# Patient Record
Sex: Female | Born: 1960 | Race: Black or African American | Hispanic: No | Marital: Married | Smoking: Former smoker
Health system: Southern US, Community
[De-identification: ages and names within clinical notes are randomized; demographics above are authoritative.]

## PROBLEM LIST (undated history)

## (undated) DIAGNOSIS — M1990 Unspecified osteoarthritis, unspecified site: Secondary | ICD-10-CM

## (undated) DIAGNOSIS — Z972 Presence of dental prosthetic device (complete) (partial): Secondary | ICD-10-CM

## (undated) DIAGNOSIS — R42 Dizziness and giddiness: Secondary | ICD-10-CM

## (undated) DIAGNOSIS — N390 Urinary tract infection, site not specified: Secondary | ICD-10-CM

## (undated) HISTORY — PX: COLONOSCOPY: SHX174

## (undated) HISTORY — PX: TUBAL LIGATION: SHX77

---

## 2005-01-03 ENCOUNTER — Other Ambulatory Visit: Payer: Self-pay

## 2005-01-03 ENCOUNTER — Emergency Department: Payer: Self-pay | Admitting: Emergency Medicine

## 2005-12-04 ENCOUNTER — Emergency Department: Payer: Self-pay | Admitting: Emergency Medicine

## 2006-03-14 ENCOUNTER — Ambulatory Visit: Payer: Self-pay | Admitting: Family Medicine

## 2006-08-26 ENCOUNTER — Emergency Department: Payer: Self-pay | Admitting: Emergency Medicine

## 2006-12-04 ENCOUNTER — Emergency Department: Payer: Self-pay | Admitting: Emergency Medicine

## 2008-08-19 ENCOUNTER — Ambulatory Visit: Payer: Self-pay

## 2008-08-27 ENCOUNTER — Ambulatory Visit: Payer: Self-pay

## 2009-11-10 ENCOUNTER — Ambulatory Visit: Payer: Self-pay

## 2013-05-20 ENCOUNTER — Emergency Department: Payer: Self-pay | Admitting: Emergency Medicine

## 2013-06-12 ENCOUNTER — Emergency Department: Payer: Self-pay | Admitting: Emergency Medicine

## 2013-06-12 LAB — URINALYSIS, COMPLETE
BACTERIA: NONE SEEN
Bilirubin,UR: NEGATIVE
Blood: NEGATIVE
Glucose,UR: NEGATIVE mg/dL (ref 0–75)
Ketone: NEGATIVE
LEUKOCYTE ESTERASE: NEGATIVE
Nitrite: NEGATIVE
PH: 6 (ref 4.5–8.0)
Protein: NEGATIVE
RBC,UR: NONE SEEN /HPF (ref 0–5)
SPECIFIC GRAVITY: 1.011 (ref 1.003–1.030)
WBC UR: <1 /HPF (ref 0–5)

## 2013-06-12 LAB — COMPREHENSIVE METABOLIC PANEL
ALBUMIN: 3.8 g/dL (ref 3.4–5.0)
AST: 18 U/L (ref 15–37)
Alkaline Phosphatase: 95 U/L
Anion Gap: 5 — ABNORMAL LOW (ref 7–16)
BILIRUBIN TOTAL: 0.3 mg/dL (ref 0.2–1.0)
BUN: 13 mg/dL (ref 7–18)
CHLORIDE: 105 mmol/L (ref 98–107)
CO2: 30 mmol/L (ref 21–32)
CREATININE: 0.78 mg/dL (ref 0.60–1.30)
Calcium, Total: 9.5 mg/dL (ref 8.5–10.1)
EGFR (African American): >60
EGFR (Non-African Amer.): >60
Glucose: 75 mg/dL (ref 65–99)
Osmolality: 278 (ref 275–301)
POTASSIUM: 3.4 mmol/L — AB (ref 3.5–5.1)
SGPT (ALT): 28 U/L (ref 12–78)
Sodium: 140 mmol/L (ref 136–145)
Total Protein: 8.1 g/dL (ref 6.4–8.2)

## 2013-06-12 LAB — CBC
HCT: 36.4 % (ref 35.0–47.0)
HGB: 12 g/dL (ref 12.0–16.0)
MCH: 28 pg (ref 26.0–34.0)
MCHC: 32.9 g/dL (ref 32.0–36.0)
MCV: 85 fL (ref 80–100)
Platelet: 286 10*3/uL (ref 150–440)
RBC: 4.29 10*6/uL (ref 3.80–5.20)
RDW: 12.9 % (ref 11.5–14.5)
WBC: 3.4 10*3/uL — AB (ref 3.6–11.0)

## 2013-06-12 LAB — LIPASE, BLOOD: Lipase: 153 U/L (ref 73–393)

## 2013-07-23 DIAGNOSIS — M5416 Radiculopathy, lumbar region: Secondary | ICD-10-CM | POA: Insufficient documentation

## 2013-07-23 DIAGNOSIS — R29898 Other symptoms and signs involving the musculoskeletal system: Secondary | ICD-10-CM | POA: Insufficient documentation

## 2014-05-26 ENCOUNTER — Emergency Department: Payer: Self-pay | Admitting: Emergency Medicine

## 2014-05-26 LAB — ED INFLUENZA
INFLBPCR: NEGATIVE
Influenza A By PCR: NEGATIVE

## 2014-05-26 LAB — URINALYSIS, COMPLETE
Bilirubin,UR: NEGATIVE
Blood: NEGATIVE
Glucose,UR: NEGATIVE mg/dL (ref 0–75)
KETONE: NEGATIVE
Nitrite: NEGATIVE
Ph: 8 (ref 4.5–8.0)
Protein: NEGATIVE
RBC,UR: 2 /HPF (ref 0–5)
SPECIFIC GRAVITY: 1.01 (ref 1.003–1.030)
WBC UR: 58 /HPF (ref 0–5)

## 2014-05-26 LAB — CBC WITH DIFFERENTIAL/PLATELET
Bands: 1 %
Comment - H1-Com1: NORMAL
HCT: 36.7 % (ref 35.0–47.0)
HGB: 12.2 g/dL (ref 12.0–16.0)
LYMPHS PCT: 27 %
MCH: 27.7 pg (ref 26.0–34.0)
MCHC: 33.2 g/dL (ref 32.0–36.0)
MCV: 84 fL (ref 80–100)
Monocytes: 8 %
Platelet: 267 10*3/uL (ref 150–440)
RBC: 4.39 10*6/uL (ref 3.80–5.20)
RDW: 12.7 % (ref 11.5–14.5)
SEGMENTED NEUTROPHILS: 64 %
WBC: 6.4 10*3/uL (ref 3.6–11.0)

## 2014-05-26 LAB — BASIC METABOLIC PANEL
ANION GAP: 9 (ref 7–16)
BUN: 7 mg/dL
CREATININE: 0.64 mg/dL
Calcium, Total: 9.5 mg/dL
Chloride: 104 mmol/L
Co2: 27 mmol/L
EGFR (African American): >60
Glucose: 81 mg/dL
Potassium: 3.4 mmol/L — ABNORMAL LOW
SODIUM: 140 mmol/L

## 2014-05-28 LAB — URINE CULTURE

## 2014-12-12 ENCOUNTER — Emergency Department
Admission: EM | Admit: 2014-12-12 | Discharge: 2014-12-12 | Disposition: A | Discharge status: Home / Self Care | Payer: BLUE CROSS/BLUE SHIELD | Attending: Emergency Medicine | Admitting: Emergency Medicine

## 2014-12-12 ENCOUNTER — Encounter: Payer: Self-pay | Admitting: Emergency Medicine

## 2014-12-12 DIAGNOSIS — N390 Urinary tract infection, site not specified: Secondary | ICD-10-CM | POA: Diagnosis not present

## 2014-12-12 DIAGNOSIS — Z87891 Personal history of nicotine dependence: Secondary | ICD-10-CM | POA: Insufficient documentation

## 2014-12-12 DIAGNOSIS — R300 Dysuria: Secondary | ICD-10-CM | POA: Diagnosis present

## 2014-12-12 LAB — URINALYSIS COMPLETE WITH MICROSCOPIC (ARMC ONLY)
BACTERIA UA: NONE SEEN
BILIRUBIN URINE: NEGATIVE
Glucose, UA: NEGATIVE mg/dL
KETONES UR: NEGATIVE mg/dL
NITRITE: NEGATIVE
PH: 8 (ref 5.0–8.0)
Protein, ur: 100 mg/dL — AB
SPECIFIC GRAVITY, URINE: 1.009 (ref 1.005–1.030)
Squamous Epithelial / LPF: NONE SEEN

## 2014-12-12 LAB — CHLAMYDIA/NGC RT PCR (ARMC ONLY)
Chlamydia Tr: NOT DETECTED
N GONORRHOEAE: NOT DETECTED

## 2014-12-12 MED ORDER — PHENAZOPYRIDINE HCL 200 MG PO TABS: 200.0000 mg | ORAL_TABLET | Freq: Three times a day (TID) | ORAL | Status: DC | PRN

## 2014-12-12 MED ORDER — CEFTRIAXONE SODIUM 1 G IJ SOLR
1.0000 g | Freq: Once | INTRAMUSCULAR | Status: AC
  Administered 2014-12-12: 1 g via INTRAVENOUS
  Filled 2014-12-12: qty 10

## 2014-12-12 MED ORDER — SULFAMETHOXAZOLE-TRIMETHOPRIM 800-160 MG PO TABS
1.0000 | ORAL_TABLET | Freq: Once | ORAL | Status: AC
Start: 2014-12-12 — End: 2014-12-12
  Administered 2014-12-12: 1 via ORAL
  Filled 2014-12-12: qty 1

## 2014-12-12 MED ORDER — SULFAMETHOXAZOLE-TRIMETHOPRIM 800-160 MG PO TABS: 1.0000 | ORAL_TABLET | Freq: Two times a day (BID) | ORAL | Status: DC

## 2014-12-12 MED ORDER — PHENAZOPYRIDINE HCL 200 MG PO TABS
200.0000 mg | ORAL_TABLET | Freq: Once | ORAL | Status: AC
  Administered 2014-12-12: 200 mg via ORAL
  Filled 2014-12-12: qty 1

## 2014-12-12 NOTE — Discharge Instructions (Signed)
Antibiotic Medicine °Antibiotic medicines are used to treat infections caused by bacteria. They work by injuring or killing the bacteria that is making you sick. °HOW IS AN ANTIBIOTIC CHOSEN? °An antibiotic is chosen based on many factors. To help your health care provider choose one for you, tell your health care provider if: °· You have any allergies. °· You are pregnant or plan to get pregnant. °· You are breastfeeding. °· You are taking any medicines. These include over-the-counter medicines, prescription medicines, and herbal remedies. °· You have a medical condition or problem you have not already discussed. °Your health care provider will also consider: °· How often the medicine has to be taken. °· Common side effects of the medicine. °· The cost of the medicine. °· The taste of the medicine. °If you have questions about why an antibiotic was chosen, make sure to ask. °FOR HOW LONG SHOULD I TAKE MY ANTIBIOTIC? °Continue to take your antibiotic for as long as told by your health care provider. Do not stop taking it when you feel better. If you stop taking it too soon: °· You may start to feel sick again. °· Your infection may become harder to treat. °· Complications may develop. °WHAT IF I MISS A DOSE? °Try not to miss any doses of medicine. If you miss a dose, take it as soon as possible. However, if it is almost time for the next dose: °· If you are taking 2 doses per day, take the missed dose and the next dose 5 to 6 hours apart. °· If you are taking 3 or more doses per day, take the missed dose and the next dose 2 to 4 hours apart, then go back to the normal schedule. °If you cannot make up a missed dose, take the next scheduled dose on time. Then take the missed dose after you have taken all the doses as recommended by your health care provider, as if you had one more dose left. °DO ANTIBIOTICS AFFECT BIRTH CONTROL? °Birth control pills may not work while you are on antibiotics. If you are taking birth  control pills, continue taking them as usual and use a second form of birth control, such as a condom, to avoid unwanted pregnancy. Continue using the second form of birth control until you are finished with your current 1 month cycle of birth control pills. °OTHER INFORMATION °· If there is any medicine left over, throw it away. °· Never take someone else's antibiotics. °· Never take leftover antibiotics. °SEEK MEDICAL CARE IF: °· You get worse. °· You do not feel better within a few days of starting the antibiotic medicine. °· You vomit. °· White patches appear in your mouth. °· You have new joint pain that begins after starting the antibiotic. °· You have new muscle aches that begin after starting the antibiotic. °· You had a fever before starting the antibiotic and it returns. °· You have any symptoms of an allergic reaction, such as an itchy rash. If this happens, stop taking the antibiotic. °SEEK IMMEDIATE MEDICAL CARE IF: °· Your urine turns dark or becomes blood-colored. °· Your skin turns yellow. °· You bruise or bleed easily. °· You have severe diarrhea and abdominal cramps. °· You have a severe headache. °· You have signs of a severe allergic reaction, such as: °¨ Trouble breathing. °¨ Wheezing. °¨ Swelling of the lips, tongue, or face. °¨ Fainting. °¨ Blisters on the skin or in the mouth. °If you have signs of a severe allergic   reaction, stop taking the antibiotic right away. °  °This information is not intended to replace advice given to you by your health care provider. Make sure you discuss any questions you have with your health care provider. °  °Document Released: 10/27/2003 Document Revised: 11/04/2014 Document Reviewed: 07/01/2014 °Elsevier Interactive Patient Education ©2016 [employer] ° °

## 2014-12-12 NOTE — ED Notes (Signed)
Pt. Driving home.

## 2014-12-12 NOTE — ED Notes (Signed)
Pt. States dysuria for the past couple days worse Friday.  Pt. States bladder infection early this year.  Pt. States frequency and painful urination.

## 2014-12-12 NOTE — ED Notes (Signed)
"  I think I have a bladder or kidney infection."  Patient reports dysuria, frequency and urgency all day Friday.

## 2014-12-12 NOTE — ED Provider Notes (Signed)
[facility] Emergency Department Provider Note  ____________________________________________  Time seen: Approximately 4:17 AM  I have reviewed the triage vital signs and the nursing notes.   HISTORY  Chief Complaint Dysuria    HPI Meghan Coleman is a 54 y.o. female who reports urgency frequency and dysuria for the last couple days. She's had a UTI before and reports the first antibiotic did not work and she ended up having to get a shot afterward. She denies fever but says she feels very warm and hot. His feels like it like a urinary tract infection. She does not think she has any reason to worry about an STD. Does not have any vaginal discharge. She does have some suprapubic pain and palpation are makes her feel a need to urinate. She denies any CVA tenderness.  History reviewed. No pertinent past medical history.  There are no active problems to display for this patient.   Past Surgical History  Procedure Laterality Date  . Tubal ligation      Current Outpatient Rx  Name  Route  Sig  Dispense  Refill  . ibuprofen (ADVIL,MOTRIN) 200 MG tablet   Oral   Take 200 mg by mouth every 8 (eight) hours as needed.         . phenazopyridine (PYRIDIUM) 200 MG tablet   Oral   Take 1 tablet (200 mg total) by mouth 3 (three) times daily as needed for pain.   20 tablet   0   . sulfamethoxazole-trimethoprim (BACTRIM DS,SEPTRA DS) 800-160 MG tablet   Oral   Take 1 tablet by mouth 2 (two) times daily.   9 tablet   0     Allergies Review of patient's allergies indicates no known allergies.  No family history on file.  Social History Social History  Substance Use Topics  . Smoking status: Former Games developer  . Smokeless tobacco: Never Used  . Alcohol Use: No    Review of Systems Constitutional: No fever/chills Eyes: No visual changes. ENT: No sore throat. Cardiovascular: Denies chest pain. Respiratory: Denies shortness of breath. Gastrointestinal:   see history of present illness No nausea, no vomiting.  No diarrhea.  No constipation. GeSee history of present illness Musculoskeletal:  patient does report bilateral flank pain below the CVA areas Skin: Negative for rash. Neurological: Negative for headaches, focal weakness or numbness.  10-point ROS otherwise negative.  ____________________________________________   PHYSICAL EXAM:  VITAL SIGNS: ED Triage Vitals  Enc Vitals Group     BP 12/12/14 0223 140/82 mmHg     Pulse Rate 12/12/14 0223 106     Resp 12/12/14 0223 20     Temp 12/12/14 0223 99.5 F (37.5 C)     Temp Source 12/12/14 0223 Oral     SpO2 12/12/14 0223 100 %     Weight 12/12/14 0223 189 lb (85.73 kg)     Height 12/12/14 0223  (1.575 m)     Head Cir --      Peak Flow --      Pain Score 12/12/14 0224 8     Pain Loc --      Pain Edu? --      Excl. in GC? --     Constitutional: Alert and oriented. Well appearing and in no acute distress. Eyes: Conjunctivae are normal. PERRL. EOMI. Head: Atraumatic. Nose: No congestion/rhinnorhea. Mouth/Throat: Mucous membranes are moist.  Oropharynx non-erythematous. Neck: No stridor.   Cardiovascular: Normal rate, regular rhythm. Grossly normal heart sounds.  Good peripheral circulation. Respiratory: Normal respiratory effort.  No retractions. Lungs CTAB. Gastrointestinal: Soft and nontender. Palpation of suprapubic area reproduces her need to urinate. No distention. No abdominal bruits. No CVA tenderness. Musculoskeletal: No lower extremity tenderness nor edema.  No joint effusions. Neurologic:  Normal speech and language. No gross focal neurologic deficits are appreciated. No gait instability. Skin:  Skin is warm, dry and intact. No rash noted.   ____________________________________________   LABS (all labs ordered are listed, but only abnormal results are displayed)  Labs Reviewed  URINALYSIS COMPLETEWITH MICROSCOPIC ([facility] ONLY) - Abnormal; Notable for the  following:    Color, Urine STRAW (*)    APPearance CLOUDY (*)    Hgb urine dipstick 3+ (*)    Protein, ur 100 (*)    Leukocytes, UA 3+ (*)    All other components within normal limits  CHLAMYDIA/NGC RT PCR ([facility] ONLY)   ____________________________________________  EKG   ____________________________________________  RADIOLOGY   ____________________________________________   PROCEDURES    ____________________________________________   INITIAL IMPRESSION / ASSESSMENT AND PLAN / ED COURSE  Pertinent labs & imaging results that were available during my care of the patient were reviewed by me and considered in my medical decision making (see chart for details).   ____________________________________________   FINAL CLINICAL IMPRESSION(S) / ED DIAGNOSES  Final diagnoses:  UTI (lower urinary tract infection)      Arnaldo NatalPaul F Malinda, MD 12/12/14 [phone]

## 2014-12-16 ENCOUNTER — Emergency Department
Admission: EM | Admit: 2014-12-16 | Discharge: 2014-12-16 | Disposition: A | Discharge status: Home / Self Care | Payer: Self-pay | Attending: Emergency Medicine | Admitting: Emergency Medicine

## 2014-12-16 DIAGNOSIS — K120 Recurrent oral aphthae: Secondary | ICD-10-CM | POA: Insufficient documentation

## 2014-12-16 DIAGNOSIS — R300 Dysuria: Secondary | ICD-10-CM | POA: Insufficient documentation

## 2014-12-16 DIAGNOSIS — Z79899 Other long term (current) drug therapy: Secondary | ICD-10-CM | POA: Insufficient documentation

## 2014-12-16 DIAGNOSIS — Z87891 Personal history of nicotine dependence: Secondary | ICD-10-CM | POA: Insufficient documentation

## 2014-12-16 MED ORDER — LIDOCAINE VISCOUS 2 % MT SOLN: 5.0000 mL | OROMUCOSAL | Status: DC | PRN

## 2014-12-16 MED ORDER — TRIAMCINOLONE ACETONIDE 0.1 % MT PSTE: 1.0000 "application " | PASTE | Freq: Two times a day (BID) | OROMUCOSAL | Status: DC

## 2014-12-16 NOTE — Discharge Instructions (Signed)
Canker Sores °Canker sores are small, painful sores that develop inside your mouth. They may also be called aphthous ulcers. You can get canker sores on the inside of your lips or cheeks, on your tongue, or anywhere inside your mouth. You can have just one canker sore or several of them. Canker sores cannot be passed from one person to another (noncontagious). These sores are different than the sores that you may get on the outside of your lips (cold sores or fever blisters). °Canker sores usually start as painful red bumps. Then they turn into small white, yellow, or gray ulcers that have red borders. The ulcers may be quite painful. The pain may be worse when you eat or drink. °CAUSES °The cause of this condition is not known. °RISK FACTORS °This condition is more likely to develop in: °· Women. °· People in their teens or 20s. °· Women who are having their menstrual period. °· People who are under a lot of emotional stress. °· People who do not get enough iron or B vitamins. °· People who have poor oral hygiene. °· People who have an injury inside the mouth. This can happen after having dental work or from chewing something hard. °SYMPTOMS °Along with the canker sore, symptoms may also include: °· Fever. °· Fatigue. °· Swollen lymph nodes in your neck. °DIAGNOSIS °This condition can be diagnosed based on your symptoms. Your health care provider will also examine your mouth. Your health care provider may also do tests if you get canker sores often or if they are very bad. Tests may include: °· Blood tests to rule out other causes of canker sores. °· Taking swabs from the sore to check for infection. °· Taking a small piece of skin from the sore (biopsy) to test it for cancer. °TREATMENT °Most canker sores clear up without treatment in about 10 days. Home care is usually the only treatment that you will need. Over-the-counter medicines can relieve discomfort. If you have severe canker sores, your health care  provider may prescribe: °· Numbing ointment to relieve pain. °· Vitamins. °· Steroid medicines. These may be given as: °¨ Oral pills. °¨ Mouth rinses. °¨ Gels. °· Antibiotic mouth rinse. °HOME CARE INSTRUCTIONS °· Apply, take, or use medicines only as directed by your health care provider. These include vitamins. °· If you were prescribed an antibiotic mouth rinse, finish all of it even if you start to feel better. °· Until the sores are healed: °¨ Do not drink coffee or citrus juices. °¨ Do not eat spicy or salty foods. °· Use a mild, over-the-counter mouth rinse as directed by your health care provider. °· Practice good oral hygiene. °¨ Floss your teeth every day. °¨ Brush your teeth with a soft brush twice each day. °SEEK MEDICAL CARE IF: °· Your symptoms do not get better after two weeks. °· You also have a fever or swollen glands. °· You get canker sores often. °· You have a canker sore that is getting larger. °· You cannot eat or drink due to your canker sores. °  °This information is not intended to replace advice given to you by your health care provider. Make sure you discuss any questions you have with your health care provider. °  °Document Released: 06/10/2010 Document Revised: 06/30/2014 Document Reviewed: 01/14/2014 °Elsevier Interactive Patient Education ©2016 Else[employer] Inc. ° °

## 2014-12-16 NOTE — ED Provider Notes (Signed)
[facility] Emergency Department Provider Note  ____________________________________________  Time seen: Approximately 11:06 AM  I have reviewed the triage vital signs and the nursing notes.   HISTORY  Chief Complaint Allergic Reaction    HPI Meghan Coleman is a 54 y.o. female patient complain oral lesions on the inner lower lip and tongue. Patient states these lesions are painful. Onset 2 days ago status post started antibiotics  for urinary tract infection.Patient denies any fever associated this complaint. Patient states she is unable to brush her teeth secondary to the pain. No palliative measures taken for this complaint. Patient is rating her pain as a 6/10. Patient discontinue the antibiotics that she thought this was a causative agent for this complaint.   History reviewed. No pertinent past medical history.  There are no active problems to display for this patient.   Past Surgical History  Procedure Laterality Date  . Tubal ligation      Current Outpatient Rx  Name  Route  Sig  Dispense  Refill  . ibuprofen (ADVIL,MOTRIN) 200 MG tablet   Oral   Take 200 mg by mouth every 8 (eight) hours as needed.         . phenazopyridine (PYRIDIUM) 200 MG tablet   Oral   Take 1 tablet (200 mg total) by mouth 3 (three) times daily as needed for pain.   20 tablet   0   . sulfamethoxazole-trimethoprim (BACTRIM DS,SEPTRA DS) 800-160 MG tablet   Oral   Take 1 tablet by mouth 2 (two) times daily.   9 tablet   0   . triamcinolone (KENALOG) 0.1 % paste   Mouth/Throat   Use as directed 1 application in the mouth or throat 2 (two) times daily.   5 g   1     Allergies Review of patient's allergies indicates no known allergies.  History reviewed. No pertinent family history.  Social History Social History  Substance Use Topics  . Smoking status: Former Games developermoker  . Smokeless tobacco: Never Used  . Alcohol Use: No    Review of  Systems Constitutional: No fever/chills Eyes: No visual changes. ENT: No sore throat. Sores in mouth. Cardiovascular: Denies chest pain. Respiratory: Denies shortness of breath. Gastrointestinal: No abdominal pain.  No nausea, no vomiting.  No diarrhea.  No constipation. Genitourinary: Positive for dysuria. Musculoskeletal: Negative for back pain. Skin: Negative for rash. Neurological: Negative for headaches, focal weakness or numbness. 10-point ROS otherwise negative.  ____________________________________________   PHYSICAL EXAM:  VITAL SIGNS: ED Triage Vitals  Enc Vitals Group     BP 12/16/14 1031 130/80 mmHg     Pulse Rate 12/16/14 1031 90     Resp 12/16/14 1031 16     Temp 12/16/14 1031 98.9 F (37.2 C)     Temp Source 12/16/14 1031 Oral     SpO2 12/16/14 1031 96 %     Weight 12/16/14 1031 189 lb (85.73 kg)     Height 12/16/14 1031 5\' 1"  (1.549 m)     Head Cir --      Peak Flow --      Pain Score 12/16/14 1032 6     Pain Loc --      Pain Edu? --      Excl. in GC? --     Constitutional: Alert and oriented. Well appearing and in no acute distress. Eyes: Conjunctivae are normal. PERRL. EOMI. Head: Atraumatic. Nose: No congestion/rhinnorhea. Mouth/Throat: Mucous membranes are moist.  Oropharynx non-erythematous.  Oval grayish lesion inside lower lip. Hematological/Lymphatic/Immunilogical: No cervical lymphadenopathy. Cardiovascular: Normal rate, regular rhythm. Grossly normal heart sounds.  Good peripheral circulation. Respiratory: Normal respiratory effort.  No retractions. Lungs CTAB. Gastrointestinal: Soft and nontender. No distention. No abdominal bruits. No CVA tenderness. Musculoskeletal: No lower extremity tenderness nor edema.  No joint effusions. Neurologic:  Normal speech and language. No gross focal neurologic deficits are appreciated. No gait instability. Skin:  Skin is warm, dry and intact. No rash noted. Psychiatric: Mood and affect are normal. Speech  and behavior are normal.  ____________________________________________   LABS (all labs ordered are listed, but only abnormal results are displayed)  Labs Reviewed - No data to display ____________________________________________  EKG   ____________________________________________  RADIOLOGY   ____________________________________________   PROCEDURES  Procedure(s) performed: None  Critical Care performed: No  ____________________________________________   INITIAL IMPRESSION / ASSESSMENT AND PLAN / ED COURSE  Pertinent labs & imaging results that were available during my care of the patient were reviewed by me and considered in my medical decision making (see chart for details).  Aphthous ulcer inner lower lip. Patient given a prescription for Orabase and viscous lidocaine. Patient advised follow-up family doctor in one week if condition persists. Patient also advised to restart antibiotics for urinary tract infection.  ____________________________________________   FINAL CLINICAL IMPRESSION(S) / ED DIAGNOSES  Final diagnoses:  Canker sores oral      Joni Reining, PA-C 12/16/14 1112  Darien Ramus, MD 12/16/14 1530

## 2014-12-16 NOTE — ED Notes (Signed)
Patient reports coming to ED this past Saturday for UTI and discharged on antibiotic, which patient can't remember name of.  Patient reports first dose on medication in ER and then took second and third dose at home when she noticed a sore throat that developed into sores in mouth.  Patient reports not taking medication in hopes that symptoms would go away.

## 2015-02-04 ENCOUNTER — Emergency Department (HOSPITAL_COMMUNITY)
Admission: EM | Admit: 2015-02-04 | Discharge: 2015-02-04 | Disposition: A | Discharge status: Home / Self Care | Payer: BLUE CROSS/BLUE SHIELD | Attending: Emergency Medicine | Admitting: Emergency Medicine

## 2015-02-04 ENCOUNTER — Encounter (HOSPITAL_COMMUNITY): Payer: Self-pay | Admitting: Emergency Medicine

## 2015-02-04 DIAGNOSIS — R110 Nausea: Secondary | ICD-10-CM | POA: Insufficient documentation

## 2015-02-04 DIAGNOSIS — L511 Stevens-Johnson syndrome: Secondary | ICD-10-CM | POA: Diagnosis not present

## 2015-02-04 DIAGNOSIS — R630 Anorexia: Secondary | ICD-10-CM | POA: Insufficient documentation

## 2015-02-04 DIAGNOSIS — Z87891 Personal history of nicotine dependence: Secondary | ICD-10-CM | POA: Insufficient documentation

## 2015-02-04 DIAGNOSIS — K1379 Other lesions of oral mucosa: Secondary | ICD-10-CM | POA: Diagnosis present

## 2015-02-04 DIAGNOSIS — T450X5A Adverse effect of antiallergic and antiemetic drugs, initial encounter: Secondary | ICD-10-CM | POA: Insufficient documentation

## 2015-02-04 LAB — COMPREHENSIVE METABOLIC PANEL
ALK PHOS: 68 U/L (ref 38–126)
ALT: 18 U/L (ref 14–54)
ANION GAP: 11 (ref 5–15)
AST: 21 U/L (ref 15–41)
Albumin: 4.2 g/dL (ref 3.5–5.0)
BILIRUBIN TOTAL: 0.5 mg/dL (ref 0.3–1.2)
BUN: 5 mg/dL — ABNORMAL LOW (ref 6–20)
CALCIUM: 10.1 mg/dL (ref 8.9–10.3)
CO2: 27 mmol/L (ref 22–32)
CREATININE: 0.69 mg/dL (ref 0.44–1.00)
Chloride: 102 mmol/L (ref 101–111)
Glucose, Bld: 105 mg/dL — ABNORMAL HIGH (ref 65–99)
Potassium: 3.8 mmol/L (ref 3.5–5.1)
SODIUM: 140 mmol/L (ref 135–145)
TOTAL PROTEIN: 8.3 g/dL — AB (ref 6.5–8.1)

## 2015-02-04 LAB — URINALYSIS, ROUTINE W REFLEX MICROSCOPIC
Bilirubin Urine: NEGATIVE
GLUCOSE, UA: NEGATIVE mg/dL
Hgb urine dipstick: NEGATIVE
KETONES UR: 15 mg/dL — AB
LEUKOCYTES UA: NEGATIVE
Nitrite: NEGATIVE
PH: 5.5 (ref 5.0–8.0)
Protein, ur: NEGATIVE mg/dL
SPECIFIC GRAVITY, URINE: 1.013 (ref 1.005–1.030)

## 2015-02-04 LAB — CBC
HEMATOCRIT: 36.2 % (ref 36.0–46.0)
HEMOGLOBIN: 12.1 g/dL (ref 12.0–15.0)
MCH: 27.9 pg (ref 26.0–34.0)
MCHC: 33.4 g/dL (ref 30.0–36.0)
MCV: 83.6 fL (ref 78.0–100.0)
Platelets: 270 10*3/uL (ref 150–400)
RBC: 4.33 MIL/uL (ref 3.87–5.11)
RDW: 12.3 % (ref 11.5–15.5)
WBC: 6.1 10*3/uL (ref 4.0–10.5)

## 2015-02-04 MED ORDER — MAGIC MOUTHWASH
15.0000 mL | Freq: Once | ORAL | Status: AC
  Administered 2015-02-04: 15 mL via ORAL
  Filled 2015-02-04: qty 15

## 2015-02-04 MED ORDER — ACETAMINOPHEN 325 MG PO TABS
650.0000 mg | ORAL_TABLET | Freq: Once | ORAL | Status: AC | PRN
  Administered 2015-02-04: 650 mg via ORAL

## 2015-02-04 MED ORDER — SODIUM CHLORIDE 0.9 % IV BOLUS (SEPSIS)
1000.0000 mL | Freq: Once | INTRAVENOUS | Status: AC
  Administered 2015-02-04: 1000 mL via INTRAVENOUS

## 2015-02-04 MED ORDER — ACETAMINOPHEN 325 MG PO TABS
ORAL_TABLET | ORAL | Status: AC
  Filled 2015-02-04: qty 2

## 2015-02-04 MED ORDER — MAGIC MOUTHWASH: 10.0000 mL | Freq: Four times a day (QID) | ORAL | Status: DC | PRN

## 2015-02-04 MED ORDER — LIDOCAINE VISCOUS 2 % MT SOLN
15.0000 mL | Freq: Once | OROMUCOSAL | Status: AC
  Administered 2015-02-04: 15 mL via OROMUCOSAL
  Filled 2015-02-04: qty 15

## 2015-02-04 NOTE — ED Provider Notes (Signed)
CSN: [id number]     Arrival date & time 02/04/15  1048 History   First MD Initiated Contact with Patient 02/04/15 1212     Chief Complaint  Patient presents with  . Medication Reaction  . Mouth Lesions  . Urinary Tract Infection     (Consider location/radiation/quality/duration/timing/severity/associated sxs/prior Treatment) HPI Comments: Took 1 dose of bactrim on Sunday and mouth sores began to show up Lesions in mouth and nose, worsening dysuria Benadryl helped, no current dysuria Eyes looked red prior to taking benadryl, throat swelling improved with benadryl Had similar reaction 1 month ago No other skin lesions/rash No SOB/lightheadedness     Patient is a 54 y.o. female presenting with mouth sores and urinary tract infection.  Mouth Lesions Associated symptoms: no fever, no neck pain, no rash and no sore throat   Urinary Tract Infection Associated symptoms: nausea   Associated symptoms: no abdominal pain, no fever and no vomiting     History reviewed. No pertinent past medical history. Past Surgical History  Procedure Laterality Date  . Tubal ligation     History reviewed. No pertinent family history. Social History  Substance Use Topics  . Smoking status: Former Games developermoker  . Smokeless tobacco: Never Used  . Alcohol Use: No   OB History    No data available     Review of Systems  Constitutional: Positive for appetite change. Negative for fever.  HENT: Positive for mouth sores. Negative for sore throat.   Eyes: Negative for visual disturbance.  Respiratory: Negative for cough and shortness of breath.   Cardiovascular: Negative for chest pain.  Gastrointestinal: Positive for nausea. Negative for vomiting and abdominal pain.  Genitourinary: Negative for dysuria (did have) and difficulty urinating.  Musculoskeletal: Negative for back pain and neck pain.  Skin: Positive for wound. Negative for rash.  Neurological: Negative for syncope, light-headedness and  headaches.      Allergies  Bactrim  Home Medications   Prior to Admission medications   Medication Sig Start Date End Date Taking? Authorizing Provider  lidocaine (XYLOCAINE) 2 % solution Use as directed 5 mLs in the mouth or throat as needed for mouth pain. Apply small amount to oral lesion as needed for pain. Patient not taking: Reported on 02/04/2015 12/16/14   Joni Reiningonald K Smith, PA-C  magic mouthwash SOLN Take 10 mLs by mouth 4 (four) times daily as needed for mouth pain. 02/04/15   Alvira MondayErin Psalms Olarte, MD  triamcinolone (KENALOG) 0.1 % paste Use as directed 1 application in the mouth or throat 2 (two) times daily. Patient not taking: Reported on 02/04/2015 12/16/14   Joni Reiningonald K Smith, PA-C   BP 132/75 mmHg  Pulse 90  Temp(Src) 100.6 F (38.1 C) (Oral)  Resp 16  SpO2 100% Physical Exam  Constitutional: She is oriented to person, place, and time. She appears well-developed and well-nourished. No distress.  HENT:  Head: Normocephalic and atraumatic.  Dry lips, oral ulcers at edge of mucosal border (no surrounding erythema, no purulence), many scatter apthous ulcers, mild erythema of tongue, no lingual swelling, no fullness of sublingual area  Left nares with mucosal lesion   Eyes: Conjunctivae and EOM are normal. Pupils are equal, round, and reactive to light. Right conjunctiva is not injected. Right conjunctiva has no hemorrhage. Left conjunctiva is not injected. Left conjunctiva has no hemorrhage. Right pupil is round and reactive. Left pupil is round and reactive. Pupils are equal.  Denies visual changes  Neck: Normal range of motion.  Cardiovascular: Normal rate,  regular rhythm, normal heart sounds and intact distal pulses.  Exam reveals no gallop and no friction rub.   No murmur heard. Pulmonary/Chest: Effort normal and breath sounds normal. No respiratory distress. She has no wheezes. She has no rales.  Abdominal: Soft. She exhibits no distension. There is no tenderness. There is no  guarding.  Musculoskeletal: She exhibits no edema or tenderness.  Neurological: She is alert and oriented to person, place, and time.  Skin: Skin is warm and dry. No rash noted. She is not diaphoretic. No erythema.  Nursing note and vitals reviewed.   ED Course  Procedures (including critical care time) Labs Review Labs Reviewed  URINALYSIS, ROUTINE W REFLEX MICROSCOPIC (NOT AT [facility]) - Abnormal; Notable for the following:    APPearance CLOUDY (*)    Ketones, ur 15 (*)    All other components within normal limits  COMPREHENSIVE METABOLIC PANEL - Abnormal; Notable for the following:    Glucose, Bld 105 (*)    BUN 5 (*)    Total Protein 8.3 (*)    All other components within normal limits  CBC    Imaging Review No results found. I have personally reviewed and evaluated these images and lab results as part of my medical decision-making.   EKG Interpretation None      MDM   Final diagnoses:  Stevens-Johnson disease (HCC)   54 year old female with no significant medical history presents with concern of oral and nasal ulcerations after starting Bactrim on Sunday.  Patient reports she took 1 dose of Bactrim and developed these oral lesions, reports she's had one episode of similar oral lesions after taking Bactrim.   Patient has signs of significant aphthous ulcers, and nasal ulcer, no conjunctivitis, no skin rash or peeling. Mucosal involvement after taking Bactrim with fever is concerning for Stevens-Johnson syndrome, however patient has no skin rash or denudation, is hemodynamically stable, with normal electrolytes.    Discussed admission vs discharge with patient for supportive care including IV fluids, however patient wishes for trial of outpatient management, monitoring of symptoms, and outpatient supportive care with fluids and return to the ED if she develops skin rash, worsening fever, redness from the lesions, or inability to tolerate po.  NO sign of ocular involvement at  this time.    Patient given 1L of NS prior to discharge, and rx for magic mouthwash.  Other possible etiologies for patients symptoms include Behcet's disease, other viral syndrome, however given timing with bactrim have suspicion for drug reaction (although dysuria experienced with both episodes may have also indicated beginning of autoimmune or other phenomena--and recommend PCP follow up).    Patient with fever 100.6, without any other localizing signs of infection, including no sign of urinary tract infection, no cough, no extending erythema or signs of superinfection of oral lesions and feel that fever is likely secondary to drug reaction.  Discussed reasons to return, need for close PCP follow up with PCP in detail and that she should NOT take bactrim. Patient discharged in stable condition with understanding of reasons to return.     Alvira Monday, MD 02/04/15 [phone]

## 2015-02-04 NOTE — ED Notes (Signed)
Pt from home with c/o mouth sores starting this past Sunday after taking medication for UTI.  Pt reports the same reaction months ago when given the same medication in the past.  Pt was told at the time the reaction was not related to the medication.  Ambulatory, NAD, A&O.

## 2015-02-04 NOTE — Discharge Instructions (Signed)
Stevens-Johnson Syndrome Stevens-Johnson syndrome is a disorder of the mucous membranes and skin. This disorder causes these things to happen:  The mucous membranes become inflamed.  The top layer of skin dies and starts to shed. The more skin that dies, the more serious the disorder becomes.  The body loses fluids quickly.  The body loses its ability to keep germs out. This condition requires immediate treatment to prevent complications such as:  Too much fluid loss.  Blood infection.  Eye damage.  Skin damage and infection.  Vision loss, if the eyes are affected.  Damage to the lungs, heart, kidneys, or liver. CAUSES The most common cause of this condition is an allergic reaction to a medicine. Medicines that are known to cause this condition include:  Antibiotic medicines.  Antiseizure medicines.  Medicine that is used to treat gout.  Cocaine.  NSAIDs. This condition can also be caused by an infection. In some cases, the cause may not be known. RISK FACTORS This condition is more likely to develop in:  People who have a variation in the HLA gene. This variation may be passed down through families (inherited).  People who have a family history of Stevens-Johnson syndrome.  People who have cancer or are having cancer treatment.  People who have a weak body defense system (immune system).  People who have systemic lupus erythematosus.  People of [place], [place], or [place] descent. SYMPTOMS This condition often begins with several days of flu-like symptoms, such as:  Fever.  Sore throat.  Fatigue.  Headache.  Muscle aches.  Dry cough.  Burning feeling in the eyes. Then, a painful red or purple rash may develop on the face, trunk, palms, or soles, and spread to other parts of the body. The rash creates blisters and open sores on the skin. If the mucous membranes are affected, the rash may be in:  The mouth.  The nose.  The eyes.  The  genitals.  The digestive tract.  The urinary tract. Other signs and symptoms include:  Shedding of the skin or mucous membrane.  Swelling of the tongue and face.  Swelling and itching of the skin (hives).  Redness, sensitivity to light, and dryness in the eyes.  Pain in the mouth and throat.  Pain when passing urine.  Pain when swallowing. DIAGNOSIS This condition is diagnosed with a physical exam. Your health care provider may also do tests, such as:  A biopsy. This involves removing a sample of skin or eye tissue to be looked at under a microscope.  Blood tests  Imaging tests. If you are having any eye symptoms, you may need to be seen by an eye specialist (ophthalmologist). TREATMENT This condition may be treated by:  Stopping medicines that you are currently taking.  Getting fluids and nourishment through an IV tube or through a tube that is passed through your nose and into your stomach (nasogastric tube).  Gently removing dead skin and putting a moist bandage (dressing) on those areas.  Applying eye drops or having eye surgery.  Using a mouthwash that numbs the mouth and throat to help with swallowing.  Medicines:  To help you relax (sedatives).  To control your pain.  To fight infection (antibiotics).  To stop skin swelling and itching. HOME CARE INSTRUCTIONS Medicines  Take over-the-counter and prescription medicines only as told by your health care provider.  If you were prescribed an antibiotic medicine, take it as told by your health care provider. Do not stop taking the  antibiotic even if you start to feel better.  If a medicine triggered your condition, talk with your health care provider before you take the medicine again. Do not take it if your health care provider tells you not to.  Do not start taking any new medicines before you ask your health care provider if they are safe for you. Other Instructions  Tell all of your health care  providers that you have had Stevens-Johnson syndrome. If the condition was caused by a medicine, always tell your health care providers which medicine caused it.  Wear a medical bracelet or necklace that says that you had this condition and tells the cause of it.  Ask your health care provider if you should be tested for the HLA gene.  Keep all follow-up visits as told by your health care provider. This is important. SEEK MEDICAL CARE IF:  You have trouble managing complications of the condition. SEEK IMMEDIATE MEDICAL CARE IF:  You have flu-like symptoms after you have an infection or after you start a new medicine.  You develop symptoms on your skin or mucous membranes again.   This information is not intended to replace advice given to you by your health care provider. Make sure you discuss any questions you have with your health care provider.   Document Released: 10/27/2010 Document Revised: 11/04/2014 Document Reviewed: 05/06/2014 Elsevier Interactive Patient Education [employer].

## 2015-07-06 ENCOUNTER — Other Ambulatory Visit: Payer: Self-pay | Admitting: Physician Assistant

## 2015-07-06 DIAGNOSIS — M25511 Pain in right shoulder: Secondary | ICD-10-CM

## 2015-07-23 ENCOUNTER — Ambulatory Visit
Admission: RE | Admit: 2015-07-23 | Discharge: 2015-07-23 | Disposition: A | Discharge status: Home / Self Care | Payer: BLUE CROSS/BLUE SHIELD | Source: Ambulatory Visit | Attending: Physician Assistant | Admitting: Physician Assistant

## 2015-07-23 DIAGNOSIS — M25511 Pain in right shoulder: Secondary | ICD-10-CM | POA: Diagnosis present

## 2015-07-23 DIAGNOSIS — S46811A Strain of other muscles, fascia and tendons at shoulder and upper arm level, right arm, initial encounter: Secondary | ICD-10-CM | POA: Diagnosis not present

## 2015-07-23 DIAGNOSIS — W19XXXA Unspecified fall, initial encounter: Secondary | ICD-10-CM | POA: Diagnosis not present

## 2015-07-23 DIAGNOSIS — M25411 Effusion, right shoulder: Secondary | ICD-10-CM | POA: Insufficient documentation

## 2015-08-16 DIAGNOSIS — M7581 Other shoulder lesions, right shoulder: Secondary | ICD-10-CM | POA: Insufficient documentation

## 2015-08-20 ENCOUNTER — Inpatient Hospital Stay: Admission: RE | Admit: 2015-08-20 | Payer: BLUE CROSS/BLUE SHIELD | Source: Ambulatory Visit

## 2015-08-20 ENCOUNTER — Encounter: Payer: Self-pay | Admitting: *Deleted

## 2015-08-20 NOTE — Patient Instructions (Signed)
  Your procedure is scheduled on: 08-24-15 Report to Same Day Surgery 2nd floor medical mall To find out your arrival time please call [phone] between 1PM - 3PM on 08-23-15  Remember: Instructions that are not followed completely may result in serious medical risk, up to and including death, or upon the discretion of your surgeon and anesthesiologist your surgery may need to be rescheduled.    _x___ 1. Do not eat food or drink liquids after midnight. No gum chewing or hard candies.     __x__ 2. No Alcohol for 24 hours before or after surgery.   __x__3. No Smoking for 24 prior to surgery.   ____  4. Bring all medications with you on the day of surgery if instructed.    __x__ 5. Notify your doctor if there is any change in your medical condition     (cold, fever, infections).     Do not wear jewelry, make-up, hairpins, clips or nail polish.  Do not wear lotions, powders, or perfumes. You may wear deodorant.  Do not shave 48 hours prior to surgery. Men may shave face and neck.  Do not bring valuables to the hospital.    [facility] any belongings or valuables.               Contacts, dentures or bridgework may not be worn into surgery.  Leave your suitcase in the car. After surgery it may be brought to your room.  For patients admitted to the hospital, discharge time is determined by your treatment team.   Patients discharged the day of surgery will not be allowed to drive home.    Please read over the following fact sheets that you were given:   [facility] and or MRSA Information   _x___ Take these medicines the morning of surgery with A SIP OF WATER:    1. GABAPENTIN  2.  3.  4.  5.  6.  ____ Fleet Enema (as directed)   ____ Use CHG Soap or sage wipes as directed on instruction sheet   ____ Use inhalers on the day of surgery and bring to hospital day of surgery  ____ Stop metformin 2 days prior to surgery    ____ Take  1/2 of usual insulin dose the night before surgery and none on the morning of surgery.   ____ Stop aspirin or coumadin, or plavix  _x__ Stop Anti-inflammatories such as Advil, Aleve, Ibuprofen, Motrin, Naproxen,          Naprosyn, Goodies powders or aspirin products. Ok to take Tylenol.   ____ Stop supplements until after surgery.    ____ Bring C-Pap to the hospital.

## 2015-08-24 ENCOUNTER — Encounter: Payer: Self-pay | Admitting: *Deleted

## 2015-08-24 ENCOUNTER — Encounter
Admission: RE | Disposition: A | Discharge status: Home / Self Care | Payer: Self-pay | Source: Ambulatory Visit | Attending: Surgery

## 2015-08-24 ENCOUNTER — Ambulatory Visit: Payer: BLUE CROSS/BLUE SHIELD | Admitting: Anesthesiology

## 2015-08-24 ENCOUNTER — Ambulatory Visit
Admission: RE | Admit: 2015-08-24 | Discharge: 2015-08-24 | Disposition: A | Discharge status: Home / Self Care | Payer: BLUE CROSS/BLUE SHIELD | Source: Ambulatory Visit | Attending: Surgery | Admitting: Surgery

## 2015-08-24 DIAGNOSIS — M75101 Unspecified rotator cuff tear or rupture of right shoulder, not specified as traumatic: Secondary | ICD-10-CM | POA: Insufficient documentation

## 2015-08-24 DIAGNOSIS — Z87891 Personal history of nicotine dependence: Secondary | ICD-10-CM | POA: Diagnosis not present

## 2015-08-24 DIAGNOSIS — M7541 Impingement syndrome of right shoulder: Secondary | ICD-10-CM | POA: Insufficient documentation

## 2015-08-24 DIAGNOSIS — M65811 Other synovitis and tenosynovitis, right shoulder: Secondary | ICD-10-CM | POA: Insufficient documentation

## 2015-08-24 HISTORY — PX: SHOULDER ARTHROSCOPY WITH OPEN ROTATOR CUFF REPAIR: SHX6092

## 2015-08-24 SURGERY — ARTHROSCOPY, SHOULDER WITH REPAIR, ROTATOR CUFF, OPEN
Anesthesia: General | Laterality: Right | Wound class: Clean

## 2015-08-24 MED ORDER — LIDOCAINE HCL (PF) 4 % IJ SOLN
INTRAMUSCULAR | Status: DC | PRN
  Administered 2015-08-24: 2.5 mL via INTRADERMAL

## 2015-08-24 MED ORDER — METOPROLOL TARTRATE 5 MG/5ML IV SOLN
5.0000 mg | Freq: Once | INTRAVENOUS | Status: AC
  Administered 2015-08-24: 5 mg via INTRAVENOUS

## 2015-08-24 MED ORDER — FENTANYL CITRATE (PF) 100 MCG/2ML IJ SOLN
INTRAMUSCULAR | Status: DC | PRN
  Administered 2015-08-24 (×2): 50 ug via INTRAVENOUS
  Administered 2015-08-24: 100 ug via INTRAVENOUS

## 2015-08-24 MED ORDER — CEFAZOLIN SODIUM-DEXTROSE 2-4 GM/100ML-% IV SOLN
2.0000 g | Freq: Once | INTRAVENOUS | Status: AC
  Administered 2015-08-24: 2 g via INTRAVENOUS

## 2015-08-24 MED ORDER — FENTANYL CITRATE (PF) 100 MCG/2ML IJ SOLN
25.0000 ug | INTRAMUSCULAR | Status: DC | PRN
  Administered 2015-08-24 (×4): 25 ug via INTRAVENOUS

## 2015-08-24 MED ORDER — FAMOTIDINE 20 MG PO TABS
ORAL_TABLET | ORAL | Status: AC
  Filled 2015-08-24: qty 1

## 2015-08-24 MED ORDER — KETOROLAC TROMETHAMINE 30 MG/ML IJ SOLN
INTRAMUSCULAR | Status: AC
  Filled 2015-08-24: qty 1

## 2015-08-24 MED ORDER — HYDROMORPHONE HCL 1 MG/ML IJ SOLN
0.2500 mg | INTRAMUSCULAR | Status: AC | PRN
  Administered 2015-08-24 (×8): 0.25 mg via INTRAVENOUS

## 2015-08-24 MED ORDER — LIDOCAINE HCL (CARDIAC) 20 MG/ML IV SOLN
INTRAVENOUS | Status: DC | PRN
  Administered 2015-08-24: 40 mg via INTRAVENOUS

## 2015-08-24 MED ORDER — DILTIAZEM HCL 25 MG/5ML IV SOLN
INTRAVENOUS | Status: AC
  Administered 2015-08-24: 20 mg via INTRAVENOUS
  Filled 2015-08-24: qty 5

## 2015-08-24 MED ORDER — ONDANSETRON HCL 4 MG/2ML IJ SOLN: 4.0000 mg | Freq: Four times a day (QID) | INTRAMUSCULAR | Status: DC | PRN

## 2015-08-24 MED ORDER — METOCLOPRAMIDE HCL 10 MG PO TABS: 5.0000 mg | ORAL_TABLET | Freq: Three times a day (TID) | ORAL | Status: DC | PRN

## 2015-08-24 MED ORDER — ESMOLOL HCL 100 MG/10ML IV SOLN
INTRAVENOUS | Status: DC | PRN
  Administered 2015-08-24: 30 mg via INTRAVENOUS
  Administered 2015-08-24: 40 mg via INTRAVENOUS
  Administered 2015-08-24: 30 mg via INTRAVENOUS

## 2015-08-24 MED ORDER — EPINEPHRINE HCL 1 MG/ML IJ SOLN
INTRAMUSCULAR | Status: AC
Start: 2015-08-24 — End: 2015-08-24
  Filled 2015-08-24: qty 1

## 2015-08-24 MED ORDER — LABETALOL HCL 5 MG/ML IV SOLN
INTRAVENOUS | Status: DC | PRN
  Administered 2015-08-24: 10 mg via INTRAVENOUS
  Administered 2015-08-24: 5 mg via INTRAVENOUS

## 2015-08-24 MED ORDER — CEFAZOLIN SODIUM-DEXTROSE 2-4 GM/100ML-% IV SOLN
INTRAVENOUS | Status: AC
  Filled 2015-08-24: qty 100

## 2015-08-24 MED ORDER — MIDAZOLAM HCL 5 MG/5ML IJ SOLN
1.0000 mg | Freq: Once | INTRAMUSCULAR | Status: AC
  Administered 2015-08-24: 1 mg via INTRAVENOUS
  Filled 2015-08-24: qty 1

## 2015-08-24 MED ORDER — FENTANYL CITRATE (PF) 100 MCG/2ML IJ SOLN
INTRAMUSCULAR | Status: AC
  Administered 2015-08-24: 25 ug via INTRAVENOUS
  Filled 2015-08-24: qty 2

## 2015-08-24 MED ORDER — MIDAZOLAM HCL 2 MG/2ML IJ SOLN
1.0000 mg | Freq: Once | INTRAMUSCULAR | Status: AC
  Administered 2015-08-24: 1 mg via INTRAVENOUS

## 2015-08-24 MED ORDER — ONDANSETRON HCL 4 MG/2ML IJ SOLN
INTRAMUSCULAR | Status: DC | PRN
  Administered 2015-08-24: 4 mg via INTRAVENOUS

## 2015-08-24 MED ORDER — ONDANSETRON HCL 4 MG/2ML IJ SOLN
INTRAMUSCULAR | Status: AC
  Filled 2015-08-24: qty 2

## 2015-08-24 MED ORDER — FENTANYL CITRATE (PF) 100 MCG/2ML IJ SOLN
50.0000 ug | Freq: Once | INTRAMUSCULAR | Status: AC
  Administered 2015-08-24: 50 ug via INTRAVENOUS

## 2015-08-24 MED ORDER — POTASSIUM CHLORIDE IN NACL 20-0.9 MEQ/L-% IV SOLN: INTRAVENOUS | Status: DC

## 2015-08-24 MED ORDER — DEXAMETHASONE SODIUM PHOSPHATE 10 MG/ML IJ SOLN
INTRAMUSCULAR | Status: DC | PRN
  Administered 2015-08-24: 4 mg via INTRAVENOUS

## 2015-08-24 MED ORDER — BUPIVACAINE-EPINEPHRINE (PF) 0.5% -1:200000 IJ SOLN
INTRAMUSCULAR | Status: AC
  Filled 2015-08-24: qty 30

## 2015-08-24 MED ORDER — SUGAMMADEX SODIUM 200 MG/2ML IV SOLN
INTRAVENOUS | Status: DC | PRN
  Administered 2015-08-24: 169.6 mg via INTRAVENOUS

## 2015-08-24 MED ORDER — ONDANSETRON HCL 4 MG PO TABS: 4.0000 mg | ORAL_TABLET | Freq: Four times a day (QID) | ORAL | Status: DC | PRN

## 2015-08-24 MED ORDER — LACTATED RINGERS IV SOLN
INTRAVENOUS | Status: DC
Start: 2015-08-24 — End: 2015-08-24
  Administered 2015-08-24 (×2): via INTRAVENOUS

## 2015-08-24 MED ORDER — OXYCODONE HCL 5 MG PO TABS
ORAL_TABLET | ORAL | Status: AC
  Administered 2015-08-24: 10 mg via ORAL
  Filled 2015-08-24: qty 2

## 2015-08-24 MED ORDER — EPINEPHRINE HCL 1 MG/ML IJ SOLN
INTRAMUSCULAR | Status: DC | PRN
  Administered 2015-08-24: 2 mL

## 2015-08-24 MED ORDER — HYDROMORPHONE HCL 1 MG/ML IJ SOLN
INTRAMUSCULAR | Status: AC
  Administered 2015-08-24: 0.25 mg via INTRAVENOUS
  Filled 2015-08-24: qty 1

## 2015-08-24 MED ORDER — KETOROLAC TROMETHAMINE 30 MG/ML IJ SOLN
30.0000 mg | Freq: Once | INTRAMUSCULAR | Status: AC
  Administered 2015-08-24: 30 mg via INTRAVENOUS

## 2015-08-24 MED ORDER — ONDANSETRON HCL 4 MG/2ML IJ SOLN
4.0000 mg | Freq: Once | INTRAMUSCULAR | Status: AC | PRN
  Administered 2015-08-24: 4 mg via INTRAVENOUS

## 2015-08-24 MED ORDER — METOCLOPRAMIDE HCL 5 MG/ML IJ SOLN
5.0000 mg | Freq: Three times a day (TID) | INTRAMUSCULAR | Status: DC | PRN
Start: 2015-08-24 — End: 2015-08-24

## 2015-08-24 MED ORDER — OXYCODONE HCL 5 MG PO TABS
5.0000 mg | ORAL_TABLET | ORAL | Status: DC | PRN
Start: 2015-08-24 — End: 2015-08-24
  Administered 2015-08-24: 10 mg via ORAL

## 2015-08-24 MED ORDER — OXYCODONE HCL 5 MG PO TABS: 5.0000 mg | ORAL_TABLET | ORAL | Status: DC | PRN

## 2015-08-24 MED ORDER — BUPIVACAINE-EPINEPHRINE 0.5% -1:200000 IJ SOLN
INTRAMUSCULAR | Status: DC | PRN
  Administered 2015-08-24: 30 mL

## 2015-08-24 MED ORDER — ROCURONIUM BROMIDE 100 MG/10ML IV SOLN
INTRAVENOUS | Status: DC | PRN
  Administered 2015-08-24: 40 mg via INTRAVENOUS
  Administered 2015-08-24: 10 mg via INTRAVENOUS

## 2015-08-24 MED ORDER — METOPROLOL TARTRATE 5 MG/5ML IV SOLN
INTRAVENOUS | Status: DC | PRN
  Administered 2015-08-24 (×5): 1 mg via INTRAVENOUS

## 2015-08-24 MED ORDER — DILTIAZEM HCL 25 MG/5ML IV SOLN
INTRAVENOUS | Status: AC
  Filled 2015-08-24: qty 5

## 2015-08-24 MED ORDER — MIDAZOLAM HCL 5 MG/5ML IJ SOLN
INTRAMUSCULAR | Status: AC
  Filled 2015-08-24: qty 5

## 2015-08-24 MED ORDER — PHENYLEPHRINE HCL 10 MG/ML IJ SOLN
INTRAMUSCULAR | Status: DC | PRN
  Administered 2015-08-24: 200 ug via INTRAVENOUS
  Administered 2015-08-24: 100 ug via INTRAVENOUS

## 2015-08-24 MED ORDER — FAMOTIDINE 20 MG PO TABS
20.0000 mg | ORAL_TABLET | Freq: Once | ORAL | Status: AC
  Administered 2015-08-24: 20 mg via ORAL

## 2015-08-24 MED ORDER — MIDAZOLAM HCL 2 MG/2ML IJ SOLN
INTRAMUSCULAR | Status: DC | PRN
  Administered 2015-08-24: 2 mg via INTRAVENOUS

## 2015-08-24 MED ORDER — METOPROLOL TARTRATE 5 MG/5ML IV SOLN
INTRAVENOUS | Status: AC
  Administered 2015-08-24: 5 mg via INTRAVENOUS
  Filled 2015-08-24: qty 5

## 2015-08-24 MED ORDER — DILTIAZEM HCL 25 MG/5ML IV SOLN
20.0000 mg | Freq: Once | INTRAVENOUS | Status: AC
  Administered 2015-08-24: 20 mg via INTRAVENOUS

## 2015-08-24 MED ORDER — METOPROLOL TARTRATE 5 MG/5ML IV SOLN
INTRAVENOUS | Status: AC
  Filled 2015-08-24: qty 5

## 2015-08-24 MED ORDER — PROPOFOL 10 MG/ML IV BOLUS
INTRAVENOUS | Status: DC | PRN
  Administered 2015-08-24: 50 mg via INTRAVENOUS
  Administered 2015-08-24: 130 mg via INTRAVENOUS

## 2015-08-24 SURGICAL SUPPLY — 45 items
ANCHOR JUGGERKNOT WTAP NDL 2.9 (Anchor) ×12 IMPLANT
ANCHOR SUT QUATTRO KNTLS 4.5 (Anchor) ×6 IMPLANT
BIT DRILL JUGRKNT W/NDL BIT2.9 (DRILL) ×1 IMPLANT
BLADE FULL RADIUS 3.5 (BLADE) ×3 IMPLANT
BUR ACROMIONIZER 4.0 (BURR) ×3 IMPLANT
BUR BR 5.5 WIDE MOUTH (BURR) IMPLANT
CANNULA SHAVER 8MMX76MM (CANNULA) ×3 IMPLANT
CHLORAPREP W/TINT 26ML (MISCELLANEOUS) ×6 IMPLANT
COVER MAYO STAND STRL (DRAPES) ×3 IMPLANT
DRAPE IMP U-DRAPE 54X76 (DRAPES) ×6 IMPLANT
DRILL JUGGERKNOT W/NDL BIT 2.9 (DRILL) ×3
DRSG OPSITE POSTOP 4X8 (GAUZE/BANDAGES/DRESSINGS) ×3 IMPLANT
ELECT REM PT RETURN 9FT ADLT (ELECTROSURGICAL) ×3
ELECTRODE REM PT RTRN 9FT ADLT (ELECTROSURGICAL) ×1 IMPLANT
GAUZE PETRO XEROFOAM 1X8 (MISCELLANEOUS) ×3 IMPLANT
GAUZE SPONGE 4X4 12PLY STRL (GAUZE/BANDAGES/DRESSINGS) ×3 IMPLANT
GLOVE BIO SURGEON STRL SZ7.5 (GLOVE) ×12 IMPLANT
GLOVE BIO SURGEON STRL SZ8 (GLOVE) ×6 IMPLANT
GLOVE BIOGEL PI IND STRL 8 (GLOVE) ×4 IMPLANT
GLOVE BIOGEL PI INDICATOR 8 (GLOVE) ×8
GLOVE INDICATOR 8.0 STRL GRN (GLOVE) ×3 IMPLANT
GOWN STRL REUS W/ TWL LRG LVL3 (GOWN DISPOSABLE) ×4 IMPLANT
GOWN STRL REUS W/ TWL XL LVL3 (GOWN DISPOSABLE) ×1 IMPLANT
GOWN STRL REUS W/TWL LRG LVL3 (GOWN DISPOSABLE) ×8
GOWN STRL REUS W/TWL XL LVL3 (GOWN DISPOSABLE) ×2
GRASPER SUT 15 45D LOW PRO (SUTURE) ×6 IMPLANT
IV LACTATED RINGER IRRG 3000ML (IV SOLUTION) ×4
IV LR IRRIG 3000ML ARTHROMATIC (IV SOLUTION) ×2 IMPLANT
MANIFOLD NEPTUNE II (INSTRUMENTS) ×3 IMPLANT
MASK FACE SPIDER DISP (MASK) ×3 IMPLANT
MAT BLUE FLOOR 46X72 FLO (MISCELLANEOUS) ×3 IMPLANT
NEEDLE REVERSE CUT 1/2 CRC (NEEDLE) ×3 IMPLANT
PACK ARTHROSCOPY SHOULDER (MISCELLANEOUS) ×3 IMPLANT
SLING ARM LRG DEEP (SOFTGOODS) ×3 IMPLANT
SLING ULTRA II LG (MISCELLANEOUS) ×3 IMPLANT
STAPLER SKIN PROX 35W (STAPLE) ×3 IMPLANT
STRAP SAFETY BODY (MISCELLANEOUS) ×3 IMPLANT
SUT ETHIBOND 0 MO6 C/R (SUTURE) ×3 IMPLANT
SUT VIC AB 2-0 CT1 27 (SUTURE) ×4
SUT VIC AB 2-0 CT1 TAPERPNT 27 (SUTURE) ×2 IMPLANT
TAPE MICROFOAM 4IN (TAPE) ×3 IMPLANT
TUBING ARTHRO INFLOW-ONLY STRL (TUBING) ×3 IMPLANT
TUBING CONNECTING 10 (TUBING) ×2 IMPLANT
TUBING CONNECTING 10' (TUBING) ×1
WAND HAND CNTRL MULTIVAC 90 (MISCELLANEOUS) ×3 IMPLANT

## 2015-08-24 NOTE — Discharge Instructions (Addendum)
Keep dressing dry and intact.  °May shower after dressing changed on post-op day #4 (Saturday).  °Cover staples with Band-Aids after drying off. °Apply ice frequently to shoulder. °Keep shoulder immobilizer on at all times except may remove for bathing purposes.Follow-up in 10-14 days or as scheduled ° ° ° °.AMBULATORY SURGERY  °DISCHARGE INSTRUCTIONS ° ° °1) The drugs that you were given will stay in your system until tomorrow so for the next 24 hours you should not: ° °A) Drive an automobile °B) Make any legal decisions °C) Drink any alcoholic beverage ° ° °2) You may resume regular meals tomorrow.  Today it is better to start with liquids and gradually work up to solid foods. ° °You may eat anything you prefer, but it is better to start with liquids, then soup and crackers, and gradually work up to solid foods. ° ° °3) Please notify your doctor immediately if you have any unusual bleeding, trouble breathing, redness and pain at the surgery site, drainage, fever, or pain not relieved by medication. °4)  ° °5) Your post-operative visit with Dr.                     °           °     is: Date:                        Time:   ° °Please call to schedule your post-operative visit. ° °6) Additional Instructions: ° °

## 2015-08-24 NOTE — Transfer of Care (Signed)
Immediate Anesthesia Transfer of Care Note  Patient: Meghan Coleman  Procedure(s) Performed: Procedure(s): SHOULDER ARTHROSCOPY WITH OPEN ROTATOR CUFF REPAIR (Right)  Patient Location: PACU  Anesthesia Type:General  Level of Consciousness: awake, alert  and responds to stimulation  Airway & Oxygen Therapy: Patient Spontanous Breathing and Patient connected to face mask oxygen  Post-op Assessment: Report given to RN and Post -op Vital signs reviewed and stable  Post vital signs: Reviewed and stable  Last Vitals:  Filed Vitals:   08/24/15 1137 08/24/15 1146  BP: 186/111 170/112  Pulse: 104 104  Temp: 36.2 C   Resp: 15 17    Last Pain:  Filed Vitals:   08/24/15 1146  PainSc: 6          Complications: No apparent anesthesia complications

## 2015-08-24 NOTE — Anesthesia Preprocedure Evaluation (Addendum)
Anesthesia Evaluation  Patient identified by MRN, date of birth, ID band Patient awake    Reviewed: Allergy & Precautions, NPO status , Patient's Chart, lab work & pertinent test results  Airway Mallampati: III  TM Distance: >3 FB     Dental  (+) Chipped   Pulmonary former smoker,    Pulmonary exam normal        Cardiovascular negative cardio ROS Normal cardiovascular exam     Neuro/Psych negative neurological ROS  negative psych ROS   GI/Hepatic negative GI ROS, Neg liver ROS,   Endo/Other  negative endocrine ROS  Renal/GU negative Renal ROS  negative genitourinary   Musculoskeletal Rotator cuff tear   Abdominal Normal abdominal exam  (+)   Peds negative pediatric ROS (+)  Hematology negative hematology ROS (+)   Anesthesia Other Findings   Reproductive/Obstetrics                            Anesthesia Physical Anesthesia Plan  ASA: II  Anesthesia Plan: General   Post-op Pain Management:  Regional for Post-op pain   Induction: Intravenous  Airway Management Planned: Oral ETT  Additional Equipment:   Intra-op Plan:   Post-operative Plan: Extubation in OR  Informed Consent: I have reviewed the patients History and Physical, chart, labs and discussed the procedure including the risks, benefits and alternatives for the proposed anesthesia with the patient or authorized representative who has indicated his/her understanding and acceptance.   Dental advisory given  Plan Discussed with: CRNA and Surgeon  Anesthesia Plan Comments: (Talked to patient about doing a R interscalene block under US guidance .  Patient understands the risks and benefits and wishes to proceed with the block)        Anesthesia Quick Evaluation

## 2015-08-24 NOTE — Op Note (Signed)
08/24/2015  11:34 AM  Patient:   Meghan Coleman  Pre-Op Diagnosis:   Impingement/tendinopathy with massive rotator cuff tear and biceps tendinopathy, right shoulder.  Postoperative diagnosis: Impingement/tendinopathy with massive rotator cuff tear and biceps tendinopathy, right shoulder.  Procedure: Limited arthroscopic debridement, arthroscopic subacromial decompression, mini-open rotator cuff repair, and mini-open biceps tenodesis, right shoulder.  Anesthesia: General endotracheal with interscalene block placed preoperatively by the anesthesiologist.  Surgeon:   Maryagnes AmosJ. Jeffrey Joshue Badal, MD  Assistant:   Horris LatinoLance McGhee, PA-C; Leary RocaMichael Maczis, PA-S  Findings: As above. There were focal grade 1-2 chondromalacial changes involving the central portion of the glenoid, as well as a small full-thickness area of articular cartilage loss in the postero-superior humeral head. The labrum demonstrated minimal fraying anteriorly but otherwise was intact. There was significant tendinopathy changes of the biceps tendon. There were full-thickness tears involving both the supraspinatus and infraspinatus tendons. The subscapularis tendon was in satisfactory condition.  Complications: None  Fluids:   1100 cc  Estimated blood loss: 10 cc  Tourniquet time: None  Drains: None  Closure: Staples   Brief clinical note: The patient is a 55 year old female with a history of right shoulder pain. The patient's symptoms have progressed despite medications, activity modification, etc. The patient's history and examination are consistent with impingement/tendinopathy with a large rotator cuff tear. These findings were confirmed by MRI scan. The patient presents at this time for definitive management of these shoulder symptoms.  Procedure: The patient underwent placement of an interscalene block by the anesthesiologist in the preoperative holding area before she was brought into the operating room  and lain in the supine position. The patient then underwent general endotracheal intubation and anesthesia before being repositioned in the beach chair position using the beach chair positioner. The right shoulder and upper extremity were prepped with ChloraPrep solution before being draped sterilely. Preoperative antibiotics were administered. A timeout was performed to confirm the proper surgical site before the expected portal sites and incision site were injected with 0.5% Sensorcaine with epinephrine. A posterior portal was created and the glenohumeral joint thoroughly inspected with the findings as described above. An anterior portal was created using an outside-in technique. The labrum and rotator cuff were further probed, again confirming the above-noted findings. Areas of extensive synovitis anteriorly and superiorly were debrided back to stable margins using the full-radius resector before the biceps tendon was released from its labral attachment using the ArthroCare wand. In addition, the frayed margins of the rotator cuff tendon tear were debrided using the full-radius resector as well. The ArthroCare wand was used to obtain hemostasis as well as to "anneal" the labrum anteriorly. The instruments were removed from the joint after suctioning the excess fluid.  The camera was repositioned through the posterior portal into the subacromial space. A separate lateral portal was created using an outside-in technique. The 3.5 mm full-radius resector was introduced and used to perform a subtotal bursectomy. The ArthroCare wand was then inserted and used to remove the periosteal tissue off the undersurface of the anterior third of the acromion as well as to recess the coracoacromial ligament from its attachment along the anterior and lateral margins of the acromion. The 4.0 mm acromionizing bur was introduced and used to complete the decompression by removing the undersurface of the anterior third of the  acromion. The full radius resector was reintroduced to remove any residual bony debris before the ArthroCare wand was reintroduced to obtain hemostasis. The instruments were then removed from the subacromial space after  suctioning the excess fluid.  An approximately 4-5 cm incision was made over the anterolateral aspect of the shoulder beginning at the anterolateral corner of the acromion and extending distally in line with the bicipital groove. This incision was carried down through the subcutaneous tissues to expose the deltoid fascia. The raphae between the anterior and middle thirds was identified and this plane developed to provide access into the subacromial space. Additional bursal tissues were debrided sharply using Metzenbaum scissors. The rotator cuff tear was readily identified. The margins were debrided sharply with a #15 blade and the exposed greater tuberosity roughened with a rongeur. The tear was repaired using three Biomet 2.9 mm JuggerKnot anchors. Several of these sutures were then brought back laterally and secured using two [place] QuattroLink anchors to create a two-layer closure. An apparent watertight closure was obtained.  The bicipital groove was identified by palpation and opened for 1-1.5 cm. The biceps tendon stump was retrieved through this defect. The floor of the bicipital groove was roughened with a curet before another Biomet 2.9 mm JuggerKnot anchor was inserted. Both sets of sutures were passed through the biceps tendon and tied securely to effect the tenodesis. The bicipital sheath was reapproximated using two #0 Ethibond interrupted sutures, incorporating the biceps tendon to further reinforce the tenodesis.  The wound was copiously irrigated with sterile saline solution before the deltoid raphae was reapproximated using 2-0 Vicryl interrupted sutures. The subcutaneous tissues were closed in two layers using 2-0 Vicryl interrupted sutures before the skin was closed using  staples. The portal sites also were closed using staples. A sterile bulky dressing was applied to the shoulder before the arm was placed into a shoulder immobilizer. The patient was then awakened, extubated, and returned to the recovery room in satisfactory condition after tolerating the procedure well.

## 2015-08-24 NOTE — Progress Notes (Signed)
To PACU - bay 12 via stretcher for block

## 2015-08-24 NOTE — Anesthesia Postprocedure Evaluation (Signed)
Anesthesia Post Note  Patient: Courtney ParisCynthia K Lasure  Procedure(s) Performed: Procedure(s) (LRB): SHOULDER ARTHROSCOPY WITH OPEN ROTATOR CUFF REPAIR (Right)  Patient location during evaluation: PACU Anesthesia Type: General Level of consciousness: awake and alert and oriented Pain management: pain level controlled Vital Signs Assessment: post-procedure vital signs reviewed and stable Respiratory status: spontaneous breathing Cardiovascular status: blood pressure returned to baseline Anesthetic complications: no Comments: Pre op interscalene block per Dr. Maisie Fushomas.   Post op supraclavicular block per Dr. Maisie Fushomas with resultant relief of mid arm pain    Last Vitals:  Filed Vitals:   08/24/15 1606 08/24/15 1658  BP: 146/92 145/92  Pulse: 96 100  Temp: 36 C   Resp: 16 16    Last Pain:  Filed Vitals:   08/24/15 1659  PainSc: 3                  Zury Fazzino

## 2015-08-24 NOTE — Anesthesia Procedure Notes (Addendum)
Procedure Name: Intubation Performed by: Lance Muss Pre-anesthesia Checklist: Emergency Drugs available, Patient identified, Suction available, Patient being monitored and Timeout performed Patient Re-evaluated:Patient Re-evaluated prior to inductionOxygen Delivery Method: Circle system utilized Preoxygenation: Pre-oxygenation with 100% oxygen Intubation Type: IV induction Ventilation: Mask ventilation without difficulty Laryngoscope Size: Mac and 3 Grade View: Grade I Tube type: Oral Tube size: 7.0 mm Number of attempts: 1 Airway Equipment and Method: Stylet and LTA kit utilized Placement Confirmation: ETT inserted through vocal cords under direct vision,  positive ETCO2 and breath sounds checked- equal and bilateral Secured at: 20 cm Tube secured with: Tape Dental Injury: Teeth and Oropharynx as per pre-operative assessment  Difficulty Due To: Difficulty was unanticipated   Anesthesia Regional Block:  Interscalene brachial plexus block  Pre-Anesthetic Checklist: ,, timeout performed, Correct Patient, Correct Site, Correct Laterality, Correct Procedure, Correct Position, site marked, Risks and benefits discussed,  Surgical consent,  Pre-op evaluation,  At surgeon's request and post-op pain management   Prep: Betadine       Needles:  Injection technique: Single-shot  Needle Type: Echogenic Stimulator Needle     Needle Length: 5cm 5 cm Needle Gauge: 21 and 21 G    Additional Needles:  Procedures: ultrasound guided (picture in chart) and nerve stimulator Interscalene brachial plexus block  Nerve Stimulator or Paresthesia:  Response: biceps flexion, 0.8 mA,   Additional Responses:   Narrative:  Injection made incrementally with aspirations every 5 mL.  Performed by: Personally  Anesthesiologist: Gunnar Bulla  Additional Notes: Functioning IV was confirmed and monitors were applied.  A 19m 22ga Arrow echogenic stimulator needle was used. Sterile prep and  drape,hand hygiene and sterile gloves were used.  Negative aspiration and negative test dose prior to incremental administration of local anesthetic. The patient tolerated the procedure well.  Ultrasound guidance: relevent anatomy identified, needle position confirmed, local anesthetic spread visualized around nerve(s), vascular puncture avoided.  Image printed for medical record. Marcaine 232mand [id number].

## 2015-08-24 NOTE — H&P (Signed)
Paper H&P to be scanned into permanent record. H&P reviewed. No changes. 

## 2015-08-25 ENCOUNTER — Encounter: Payer: Self-pay | Admitting: Surgery

## 2016-02-07 ENCOUNTER — Other Ambulatory Visit: Payer: Self-pay | Admitting: Surgery

## 2016-02-07 DIAGNOSIS — S46101D Unspecified injury of muscle, fascia and tendon of long head of biceps, right arm, subsequent encounter: Secondary | ICD-10-CM

## 2016-03-01 ENCOUNTER — Ambulatory Visit
Admission: RE | Admit: 2016-03-01 | Discharge: 2016-03-01 | Disposition: A | Discharge status: Home / Self Care | Payer: BLUE CROSS/BLUE SHIELD | Source: Ambulatory Visit | Attending: Surgery | Admitting: Surgery

## 2016-03-01 DIAGNOSIS — S46101D Unspecified injury of muscle, fascia and tendon of long head of biceps, right arm, subsequent encounter: Secondary | ICD-10-CM | POA: Insufficient documentation

## 2016-03-01 DIAGNOSIS — X58XXXD Exposure to other specified factors, subsequent encounter: Secondary | ICD-10-CM | POA: Insufficient documentation

## 2016-07-10 ENCOUNTER — Ambulatory Visit: Payer: Self-pay | Admitting: Urology

## 2016-08-21 ENCOUNTER — Encounter: Payer: Self-pay | Admitting: Urology

## 2016-08-21 ENCOUNTER — Ambulatory Visit: Payer: BLUE CROSS/BLUE SHIELD | Admitting: Urology

## 2016-08-21 VITALS — BP 122/77 | HR 87 | Ht 61.0 in | Wt 188.4 lb

## 2016-08-21 DIAGNOSIS — N3946 Mixed incontinence: Secondary | ICD-10-CM

## 2016-08-21 DIAGNOSIS — R350 Frequency of micturition: Secondary | ICD-10-CM

## 2016-08-21 LAB — URINALYSIS, COMPLETE
Bilirubin, UA: NEGATIVE
Glucose, UA: NEGATIVE
Ketones, UA: NEGATIVE
Nitrite, UA: NEGATIVE
PH UA: 5.5 (ref 5.0–7.5)
PROTEIN UA: NEGATIVE
RBC, UA: NEGATIVE
Specific Gravity, UA: 1.02 (ref 1.005–1.030)
Urobilinogen, Ur: 0.2 mg/dL (ref 0.2–1.0)

## 2016-08-21 LAB — MICROSCOPIC EXAMINATION: RBC, UA: NONE SEEN /hpf (ref 0–?)

## 2016-08-21 LAB — BLADDER SCAN AMB NON-IMAGING: SCAN RESULT: 13

## 2016-08-21 NOTE — Progress Notes (Unsigned)
08/21/2016 3:42 PM   Courtney Parisynthia K Bence [birth year] [id number]  Referring provider: [facility] [address] [place], [place] [address]  Chief Complaint  Patient presents with  . Urinary Frequency    HPI: I was consulted to assess the patient's urinary incontinence worsening over months to years. She leaks with coughing sneezing bending and lifting and also reports urge incontinence and both are significant. She can have mild bedwetting but also significant foot on the floor syndrome. She wears 3 pads per day moderately wet.  She voids every 90 minutes and gets up 3 times per night to urinate.  She has no neurologic issues. She rarely gets a bladder infection. She has not had bladder surgery or a hysterectomy and her bowel movements are normal  Modifying factors: There are no other modifying factors  Associated signs and symptoms: There are no other associated signs and symptoms Aggravating and relieving factors: There are no other aggravating or relieving factors Severity: Moderate Duration: Persistent   PMH: History reviewed. No pertinent past medical history.  Surgical History: Past Surgical History:  Procedure Laterality Date  . COLONOSCOPY    . SHOULDER ARTHROSCOPY WITH OPEN ROTATOR CUFF REPAIR Right 08/24/2015   Procedure: SHOULDER ARTHROSCOPY WITH OPEN ROTATOR CUFF REPAIR;  Surgeon: Christena FlakeJohn J Poggi, MD;  Location: [facility] ORS;  Service: Orthopedics;  Laterality: Right;  . TUBAL LIGATION      Home Medications:  Allergies as of 08/21/2016      Reactions   Bactrim [sulfamethoxazole-trimethoprim] Other (See Comments)   Burning to her mouth      Medication List       Accurate as of 08/21/16  3:42 PM. Always use your most recent med list.          naproxen 375 MG tablet Commonly known as:  NAPROSYN Take 375 mg by mouth 2 (two) times daily with a meal.       Allergies:  Allergies  Allergen Reactions  . Bactrim  [Sulfamethoxazole-Trimethoprim] Other (See Comments)    Burning to her mouth    Family History: Family History  Problem Relation Age of Onset  . Bladder Cancer Neg Hx   . Kidney cancer Neg Hx     Social History:  reports that she quit smoking about 31 years ago. Her smoking use included Cigarettes. She smoked 0.25 packs per day. She has never used smokeless tobacco. She reports that she does not drink alcohol or use drugs.  ROS: UROLOGY Frequent Urination?: Yes Hard to postpone urination?: No Burning/pain with urination?: No Get up at night to urinate?: Yes Leakage of urine?: Yes Urine stream starts and stops?: No Trouble starting stream?: No Do you have to strain to urinate?: No Blood in urine?: No Urinary tract infection?: No Sexually transmitted disease?: No Injury to kidneys or bladder?: No Painful intercourse?: No Weak stream?: No Currently pregnant?: No Vaginal bleeding?: No Last menstrual period?: n  Gastrointestinal Nausea?: No Vomiting?: No Indigestion/heartburn?: No Diarrhea?: No Constipation?: No  Constitutional Fever: No Night sweats?: No Weight loss?: No Fatigue?: No  Skin Skin rash/lesions?: No Itching?: No  Eyes Blurred vision?: No Double vision?: No  Ears/Nose/Throat Sore throat?: No Sinus problems?: No  Hematologic/Lymphatic Swollen glands?: No Easy bruising?: No  Cardiovascular Leg swelling?: No Chest pain?: No  Respiratory Cough?: No Shortness of breath?: No  Endocrine Excessive thirst?: No  Musculoskeletal Back pain?: Yes Joint pain?: Yes  Neurological Headaches?: No Dizziness?: No  Psychologic Depression?: No Anxiety?: No  Physical Exam: BP 122/77 (BP Location: Left Arm, Patient Position: Sitting, Cuff Size: Normal)   Pulse 87   Ht 5\' 1"  (1.549 m)   Wt 188 lb 6.4 oz (85.5 kg)   BMI 35.60 kg/m   Constitutional:  Alert and oriented, No acute distress. HEENT: [place] AT, moist mucus membranes.  Trachea midline,  no masses. Cardiovascular: No clubbing, cyanosis, or edema. Respiratory: Normal respiratory effort, no increased work of breathing. GI: Abdomen is soft, nontender, nondistended, no abdominal masses GU: Mild to moderate grade 2 hypermobility the bladder neck and a negative cough test. Grade 1 cystocele and no rectocele Skin: No rashes, bruises or suspicious lesions. Lymph: No cervical or inguinal adenopathy. Neurologic: Grossly intact, no focal deficits, moving all 4 extremities. Psychiatric: Normal mood and affect.  Laboratory Data: Lab Results  Component Value Date   WBC 6.1 02/04/2015   HGB 12.1 02/04/2015   HCT 36.2 02/04/2015   MCV 83.6 02/04/2015   PLT 270 02/04/2015    Lab Results  Component Value Date   CREATININE 0.69 02/04/2015    No results found for: PSA  No results found for: TESTOSTERONE  No results found for: HGBA1C  Urinalysis    Component Value Date/Time   COLORURINE YELLOW 02/04/2015 1139   APPEARANCEUR CLOUDY (A) 02/04/2015 1139   APPEARANCEUR Hazy 05/26/2014 1545   LABSPEC 1.013 02/04/2015 1139   LABSPEC 1.010 05/26/2014 1545   PHURINE 5.5 02/04/2015 1139   GLUCOSEU NEGATIVE 02/04/2015 1139   GLUCOSEU Negative 05/26/2014 1545   HGBUR NEGATIVE 02/04/2015 1139   BILIRUBINUR NEGATIVE 02/04/2015 1139   BILIRUBINUR Negative 05/26/2014 1545   KETONESUR 15 (A) 02/04/2015 1139   PROTEINUR NEGATIVE 02/04/2015 1139   NITRITE NEGATIVE 02/04/2015 1139   LEUKOCYTESUR NEGATIVE 02/04/2015 1139   LEUKOCYTESUR 1+ 05/26/2014 1545    Pertinent Imaging: none  Assessment & Plan:  The patient has mixed incontinence and moderate nighttime symptoms including enuresis. She likely has a significant overactive bladder component. She has moderate frequency and nighttime frequency as well. The role of urodynamics discussed. The patient did not have stress incontinence today but could not cough or  1. Frequent urination 2. Mixed incontinence - Urinalysis, Complete -  Bladder Scan (Post Void Residual) in office   No Follow-up on file.  Martina Sinner, MD  [facility] [address] [place], [place] [address] [phone]

## 2016-09-07 ENCOUNTER — Other Ambulatory Visit: Payer: Self-pay | Admitting: Urology

## 2016-09-25 ENCOUNTER — Ambulatory Visit: Payer: BLUE CROSS/BLUE SHIELD | Admitting: Urology

## 2016-09-25 ENCOUNTER — Encounter: Payer: Self-pay | Admitting: Urology

## 2016-09-25 VITALS — BP 116/76 | HR 89 | Ht 61.0 in | Wt 191.0 lb

## 2016-09-25 DIAGNOSIS — N3946 Mixed incontinence: Secondary | ICD-10-CM

## 2016-09-25 MED ORDER — MIRABEGRON ER 50 MG PO TB24: 50.0000 mg | ORAL_TABLET | Freq: Every day | ORAL | 11 refills | Status: DC

## 2016-09-25 NOTE — Progress Notes (Signed)
09/25/2016 3:15 PM   Meghan Coleman December 19, 1960 [id number]  Referring provider: [facility] [address] [place], [place] [address]  No chief complaint on file.   HPI: I was consulted to assess the patient's urinary incontinence worsening over months to years. She leaks with coughing sneezing bending and lifting and also reports urge incontinence and both are significant. She can have mild bedwetting but also significant foot on the floor syndrome. She wears 3 pads per day moderately wet.  She voids every 90 minutes and gets up 3 times per night to urinate.  Mild to moderate grade 2 hypermobility the bladder neck and a negative cough test. Grade 1 cystocele and no rectocele  The patient has mixed incontinence and moderate nighttime symptoms including enuresis. She likely has a significant overactive bladder component. She has moderate frequency and nighttime frequency as well.  Today Frequency stable. Incontinence stable. On urodynamics she emptied efficiently. Her bladder capacity was 260 mL. Bladder was unstable reaching a pressure of 15 cm water. She leaked a mild amount. She was triggering instability with coughing. [name] felt that she did leak at 80 cm of water and 100 mL during the second filling. She then triggered another contraction. She did not leak at 86 cm water. During voluntary voiding she voided 148 mL with a maximal flow 20 mils per second. Maximum voiding pressure was 17 cm water. She emptied efficiently. EMG activity was normal. Bladder neck descended 1 or 2 cm. The details of the urodynamics are signed and dictated  PMH: No past medical history on file.  Surgical History: Past Surgical History:  Procedure Laterality Date  . COLONOSCOPY    . SHOULDER ARTHROSCOPY WITH OPEN ROTATOR CUFF REPAIR Right 08/24/2015   Procedure: SHOULDER ARTHROSCOPY WITH OPEN ROTATOR CUFF REPAIR;  Surgeon: Christena FlakeJohn J Poggi, MD;  Location: [facility] ORS;   Service: Orthopedics;  Laterality: Right;  . TUBAL LIGATION      Home Medications:  Allergies as of 09/25/2016      Reactions   Bactrim [sulfamethoxazole-trimethoprim] Other (See Comments)   Burning to her mouth      Medication List       Accurate as of 09/25/16  3:15 PM. Always use your most recent med list.          gabapentin 300 MG capsule Commonly known as:  NEURONTIN   naproxen 375 MG tablet Commonly known as:  NAPROSYN Take 375 mg by mouth 2 (two) times daily with a meal.       Allergies:  Allergies  Allergen Reactions  . Bactrim [Sulfamethoxazole-Trimethoprim] Other (See Comments)    Burning to her mouth    Family History: Family History  Problem Relation Age of Onset  . Bladder Cancer Neg Hx   . Kidney cancer Neg Hx     Social History:  reports that she quit smoking about 31 years ago. Her smoking use included Cigarettes. She smoked 0.25 packs per day. She has never used smokeless tobacco. She reports that she does not drink alcohol or use drugs.  ROS: UROLOGY Frequent Urination?: No Hard to postpone urination?: No Burning/pain with urination?: No Get up at night to urinate?: Yes Leakage of urine?: Yes Urine stream starts and stops?: No Trouble starting stream?: No Do you have to strain to urinate?: No Blood in urine?: No Urinary tract infection?: No Sexually transmitted disease?: No Injury to kidneys or bladder?: No Painful intercourse?: No Weak stream?: No Currently pregnant?: No Vaginal bleeding?:  No Last menstrual period?: n  Gastrointestinal Nausea?: No Vomiting?: No Indigestion/heartburn?: No Diarrhea?: No Constipation?: No  Constitutional Fever: No Night sweats?: No Weight loss?: No Fatigue?: No  Skin Skin rash/lesions?: No Itching?: No  Eyes Blurred vision?: No Double vision?: No  Ears/Nose/Throat Sore throat?: No Sinus problems?: No  Hematologic/Lymphatic Swollen glands?: No Easy bruising?:  No  Cardiovascular Leg swelling?: No Chest pain?: No  Respiratory Cough?: No Shortness of breath?: No  Endocrine Excessive thirst?: No  Musculoskeletal Back pain?: Yes Joint pain?: Yes  Neurological Headaches?: No Dizziness?: No  Psychologic Depression?: No Anxiety?: No  Physical Exam: BP 116/76   Pulse 89   Ht 5\' 1"  (1.549 m)   Wt 191 lb (86.6 kg)   BMI 36.09 kg/m   Constitutional:  Alert and oriented, No acute distress.   Laboratory Data: Lab Results  Component Value Date   WBC 6.1 02/04/2015   HGB 12.1 02/04/2015   HCT 36.2 02/04/2015   MCV 83.6 02/04/2015   PLT 270 02/04/2015    Lab Results  Component Value Date   CREATININE 0.69 02/04/2015    No results found for: PSA  No results found for: TESTOSTERONE  No results found for: HGBA1C  Urinalysis    Component Value Date/Time   COLORURINE YELLOW 02/04/2015 1139   APPEARANCEUR Clear 08/21/2016 1528   LABSPEC 1.013 02/04/2015 1139   LABSPEC 1.010 05/26/2014 1545   PHURINE 5.5 02/04/2015 1139   GLUCOSEU Negative 08/21/2016 1528   GLUCOSEU Negative 05/26/2014 1545   HGBUR NEGATIVE 02/04/2015 1139   BILIRUBINUR Negative 08/21/2016 1528   BILIRUBINUR Negative 05/26/2014 1545   KETONESUR 15 (A) 02/04/2015 1139   PROTEINUR Negative 08/21/2016 1528   PROTEINUR NEGATIVE 02/04/2015 1139   NITRITE Negative 08/21/2016 1528   NITRITE NEGATIVE 02/04/2015 1139   LEUKOCYTESUR Trace (A) 08/21/2016 1528   LEUKOCYTESUR 1+ 05/26/2014 1545    Pertinent Imaging:   Assessment & Plan:  The patient primarily has an overactive bladder with mild stress incontinence by history and perhaps urodynamics. One would need to re-quantitate the stress component if she fails medical and behavioral therapy for the overactive bladder component.  The patient will be reassessed in 4-6 weeks on the beta 3 agonist 50 mg samples and perception. She chose not to see the physical therapist. She is also hoping to be held without  surgery but is willing to do so if needed for her quality of life   There are no diagnoses linked to this encounter.  No Follow-up on file.  Martina SinnerMACDIARMID,Racquelle Hyser A, MD  [facility] [address] [place], [place] [address] [phone]

## 2016-10-12 ENCOUNTER — Telehealth: Payer: Self-pay | Admitting: Urology

## 2016-10-12 NOTE — Telephone Encounter (Signed)
The pt called and said samples of Myrbetriq 50 mg were not working.  She has an appt on 9/12. Dr Sherron MondayMacDiarmid told her to call back if not working and do not get Rx filled.  She is wondering if he needs to call something else in before appt.  Please call pt [phone]

## 2016-10-20 NOTE — Telephone Encounter (Signed)
Spoke with pt in reference to vesicare. Pt voiced understanding. Samples were left up front.

## 2016-10-20 NOTE — Telephone Encounter (Signed)
I recommended Vesicare 5 mg samples and prescription

## 2016-11-08 ENCOUNTER — Ambulatory Visit: Payer: BLUE CROSS/BLUE SHIELD | Admitting: Urology

## 2016-11-08 ENCOUNTER — Encounter: Payer: Self-pay | Admitting: Urology

## 2016-11-08 VITALS — BP 113/77 | HR 92 | Ht 61.0 in | Wt 192.3 lb

## 2016-11-08 DIAGNOSIS — N3946 Mixed incontinence: Secondary | ICD-10-CM

## 2016-11-08 MED ORDER — SOLIFENACIN SUCCINATE 10 MG PO TABS: 10.0000 mg | ORAL_TABLET | Freq: Every day | ORAL | 11 refills | Status: DC

## 2016-11-08 NOTE — Progress Notes (Signed)
11/08/2016 4:40 PM   Meghan Coleman 07/17/1960 [id number]  Referring provider: Center, [facility] [address] [place], [place] [address]  Chief Complaint  Patient presents with  . Urinary Incontinence    HPI: I was consulted to assess the patient's urinary incontinence worsening over months to years. She leaks with coughing sneezing bending and lifting and also reports urge incontinence and both are significant. She can have mild bedwetting but also significant foot on the floor syndrome. She wears 3 pads per day moderately wet.  She voids every 90 minutes and gets up 3 times per night to urinate.  Mild to moderate grade 2 hypermobility the bladder neck and a negative cough test. Grade 1 cystocele and no rectocele  The patient has mixed incontinence and moderate nighttime symptoms including enuresis. She likely has a significant overactive bladder component. She has moderate frequency and nighttime frequency as well.  Last visit Frequency stable. Incontinence stable. On urodynamics she emptied efficiently. Her bladder capacity was 260 mL. Bladder was unstable reaching a pressure of 15 cm water. She leaked a mild amount. She was triggering instability with coughing. [name] felt that she did leak at 80 cm of water and 100 mL during the second filling. She then triggered another contraction. She did not leak at 86 cm water. During voluntary voiding she voided 148 mL with a maximal flow 20 mils per second. Maximum voiding pressure was 17 cm water. She emptied efficiently. EMG activity was normal. Bladder neck descended 1 or 2 cm.   The patient primarily has an overactive bladder with mild stress incontinence by history and perhaps urodynamics. One would need to re-quantitate the stress component if she fails medical and behavioral therapy for the overactive bladder component.  The patient will be reassessed in 4-6 weeks on the beta 3 agonist 50 mg  samples and perception. She chose not to see the physical therapist. She is also hoping to be helped without surgery but is willing to do so if needed for her quality of life  Today The beta 3 agonist helped for a few weeks but the effect wore off. She is actually doing quite well on Vesicare 5 mg. I gave her prescription a 10 mg. Clinically noninfected frequency stable   PMH: History reviewed. No pertinent past medical history.  Surgical History: Past Surgical History:  Procedure Laterality Date  . COLONOSCOPY    . SHOULDER ARTHROSCOPY WITH OPEN ROTATOR CUFF REPAIR Right 08/24/2015   Procedure: SHOULDER ARTHROSCOPY WITH OPEN ROTATOR CUFF REPAIR;  Surgeon: Christena FlakeJohn J Poggi, MD;  Location: [facility] ORS;  Service: Orthopedics;  Laterality: Right;  . TUBAL LIGATION      Home Medications:  Allergies as of 11/08/2016      Reactions   Bactrim [sulfamethoxazole-trimethoprim] Other (See Comments)   Burning to her mouth      Medication List       Accurate as of 11/08/16  4:40 PM. Always use your most recent med list.          gabapentin 300 MG capsule Commonly known as:  NEURONTIN   mirabegron ER 50 MG Tb24 tablet Commonly known as:  MYRBETRIQ Take 1 tablet (50 mg total) by mouth daily.   naproxen 375 MG tablet Commonly known as:  NAPROSYN Take 375 mg by mouth 2 (two) times daily with a meal.       Allergies:  Allergies  Allergen Reactions  . Bactrim [Sulfamethoxazole-Trimethoprim] Other (See Comments)    Burning  to her mouth    Family History: Family History  Problem Relation Age of Onset  . Bladder Cancer Neg Hx   . Kidney cancer Neg Hx     Social History:  reports that she quit smoking about 31 years ago. Her smoking use included Cigarettes. She smoked 0.25 packs per day. She has never used smokeless tobacco. She reports that she does not drink alcohol or use drugs.  ROS: UROLOGY Frequent Urination?: Yes Hard to postpone urination?: No Burning/pain with urination?:  No Get up at night to urinate?: Yes Leakage of urine?: Yes Urine stream starts and stops?: No Trouble starting stream?: No Do you have to strain to urinate?: No Blood in urine?: No Urinary tract infection?: No Sexually transmitted disease?: No Injury to kidneys or bladder?: No Painful intercourse?: No Weak stream?: No Currently pregnant?: No Vaginal bleeding?: No Last menstrual period?: n  Gastrointestinal Nausea?: No Vomiting?: No Indigestion/heartburn?: No Diarrhea?: No Constipation?: No  Constitutional Fever: No Night sweats?: No Weight loss?: No Fatigue?: No  Skin Skin rash/lesions?: No Itching?: No  Eyes Blurred vision?: No Double vision?: No  Ears/Nose/Throat Sore throat?: No Sinus problems?: No  Hematologic/Lymphatic Swollen glands?: No Easy bruising?: No  Cardiovascular Leg swelling?: No Chest pain?: No  Respiratory Cough?: No Shortness of breath?: No  Endocrine Excessive thirst?: No  Musculoskeletal Back pain?: No Joint pain?: No  Neurological Headaches?: No Dizziness?: No  Psychologic Depression?: No Anxiety?: No  Physical Exam: BP 113/77 (BP Location: Left Arm, Patient Position: Sitting, Cuff Size: Normal)   Pulse 92   Ht  (1.549 m)   Wt 192 lb 4.8 oz (87.2 kg)   BMI 36.33 kg/m     Laboratory Data: Lab Results  Component Value Date   WBC 6.1 02/04/2015   HGB 12.1 02/04/2015   HCT 36.2 02/04/2015   MCV 83.6 02/04/2015   PLT 270 02/04/2015    Lab Results  Component Value Date   CREATININE 0.69 02/04/2015    No results found for: PSA  No results found for: TESTOSTERONE  No results found for: HGBA1C  Urinalysis    Component Value Date/Time   COLORURINE YELLOW 02/04/2015 1139   APPEARANCEUR Clear 08/21/2016 1528   LABSPEC 1.013 02/04/2015 1139   LABSPEC 1.010 05/26/2014 1545   PHURINE 5.5 02/04/2015 1139   GLUCOSEU Negative 08/21/2016 1528   GLUCOSEU Negative 05/26/2014 1545   HGBUR NEGATIVE  02/04/2015 1139   BILIRUBINUR Negative 08/21/2016 1528   BILIRUBINUR Negative 05/26/2014 1545   KETONESUR 15 (A) 02/04/2015 1139   PROTEINUR Negative 08/21/2016 1528   PROTEINUR NEGATIVE 02/04/2015 1139   NITRITE Negative 08/21/2016 1528   NITRITE NEGATIVE 02/04/2015 1139   LEUKOCYTESUR Trace (A) 08/21/2016 1528   LEUKOCYTESUR 1+ 05/26/2014 1545    Pertinent Imaging: none  Assessment & Plan:  Reassess in 2 months and Vesicare 10 mg  There are no diagnoses linked to this encounter.  No Follow-up on file.  Martina Sinner, MD  [facility] [address], [address] [place], [place] [address] [phone]

## 2016-11-30 ENCOUNTER — Telehealth: Payer: Self-pay

## 2016-11-30 NOTE — Telephone Encounter (Signed)
PA for vesicare has been DENIED!

## 2016-12-18 ENCOUNTER — Other Ambulatory Visit: Payer: Self-pay

## 2016-12-18 DIAGNOSIS — N3281 Overactive bladder: Secondary | ICD-10-CM

## 2016-12-18 MED ORDER — SOLIFENACIN SUCCINATE 10 MG PO TABS: 10.0000 mg | ORAL_TABLET | Freq: Every day | ORAL | 11 refills | Status: DC

## 2016-12-20 ENCOUNTER — Ambulatory Visit: Payer: BLUE CROSS/BLUE SHIELD

## 2016-12-22 ENCOUNTER — Telehealth: Payer: Self-pay | Admitting: *Deleted

## 2016-12-22 DIAGNOSIS — N3281 Overactive bladder: Secondary | ICD-10-CM

## 2016-12-22 NOTE — Telephone Encounter (Signed)
[employer] DENIED the prior authorization for Vesicare.  Patient must have tried and failed the generic.  We do not have any more Vesicare 10mg  samples to give to patient.    Please advise if we can prescribe the generic.  Patient has been without medication for approximately 1 week.

## 2016-12-22 NOTE — Telephone Encounter (Signed)
Patient left a message on Voicemail with just name and number received by Glenice BowLisa Hartley. LMOM for patient to contact office back about message.

## 2016-12-22 NOTE — Telephone Encounter (Signed)
Oxy ER 10 mg 

## 2016-12-26 MED ORDER — OXYBUTYNIN CHLORIDE ER 10 MG PO TB24: 10.0000 mg | ORAL_TABLET | Freq: Every day | ORAL | 11 refills | Status: DC

## 2016-12-26 NOTE — Telephone Encounter (Signed)
Medication was sent to pharmacy.

## 2017-01-08 ENCOUNTER — Ambulatory Visit (INDEPENDENT_AMBULATORY_CARE_PROVIDER_SITE_OTHER): Payer: BLUE CROSS/BLUE SHIELD | Admitting: Urology

## 2017-01-08 ENCOUNTER — Encounter: Payer: Self-pay | Admitting: Urology

## 2017-01-08 VITALS — BP 152/86 | HR 88 | Ht 61.0 in | Wt 167.6 lb

## 2017-01-08 DIAGNOSIS — N3281 Overactive bladder: Secondary | ICD-10-CM | POA: Diagnosis not present

## 2017-01-08 DIAGNOSIS — N3946 Mixed incontinence: Secondary | ICD-10-CM | POA: Diagnosis not present

## 2017-01-08 MED ORDER — OXYBUTYNIN CHLORIDE ER 10 MG PO TB24: 10.0000 mg | ORAL_TABLET | Freq: Two times a day (BID) | ORAL | 11 refills | Status: DC

## 2017-01-08 NOTE — Progress Notes (Signed)
01/08/2017 4:08 PM   Meghan Coleman May 18, 1960 [id number]  Referring provider: [facility], [facility] [address] [place], [place] [address]  Chief Complaint  Patient presents with  . Urinary Incontinence    HPI: I was consulted to assess the patient's urinary incontinence worsening over months to years. She leaks with coughing sneezing bending and lifting and also reports urge incontinence and both are significant. She can have mild bedwetting but also significant foot on the floor syndrome. She wears 3 pads per day moderately wet.  She voids every 90 minutes and gets up 3 times per night to urinate.  Mild to moderate grade 2 hypermobility the bladder neck and a negative cough test. Grade 1 cystocele and no rectocele  The patient has mixed incontinence and moderate nighttime symptoms including enuresis. She likely has a significant overactive bladder component. She has moderate frequency and nighttime frequency as well.  On urodynamics she emptied efficiently. Her bladder capacity was 260 mL. Bladder was unstable reaching a pressure of 15 cm water. She leaked a mild amount. She was triggering instability with coughing. [name] felt that she did leak at 80 cm of water and 100 mL during the second filling. She then triggered another contraction. She did not leak at 86 cm water. During voluntary voiding she voided 148 mL with a maximal flow 20 mils per second. Maximum voiding pressure was 17 cm water. She emptied efficiently. EMG activity was normal. Bladder neck descended 1 or 2 cm.   The patient primarily has an overactive bladder with mild stress incontinence by history and perhaps urodynamics. One would need to re-quantitate the stress component if she fails medical and behavioral therapy for the overactive bladder component.  She chose not to see the physical therapist. She is also hoping to be helped without surgery but is willing to do so if needed  for her quality of life  Last visit The beta 3 agonist helped for a few weeks but the effect wore off. She is actually doing quite well on Vesicare 5 mg. I gave her prescription a 10 mg.   Today The patient is on oxybutynin twice per day and she is taking extended release 10 mg in the morning and at night.  She gets up once at night which is dramatic improvement.  She is completely dry.  She is very pleased   PMH: History reviewed. No pertinent past medical history.  Surgical History: Past Surgical History:  Procedure Laterality Date  . COLONOSCOPY    . TUBAL LIGATION      Home Medications:  Allergies as of 01/08/2017      Reactions   Bactrim [sulfamethoxazole-trimethoprim] Other (See Comments)   Burning to her mouth      Medication List        Accurate as of 01/08/17  4:08 PM. Always use your most recent med list.          gabapentin 300 MG capsule Commonly known as:  NEURONTIN   mirabegron ER 50 MG Tb24 tablet Commonly known as:  MYRBETRIQ Take 1 tablet (50 mg total) by mouth daily.   naproxen 375 MG tablet Commonly known as:  NAPROSYN Take 375 mg by mouth 2 (two) times daily with a meal.   oxybutynin 10 MG 24 hr tablet Commonly known as:  DITROPAN-XL Take 1 tablet (10 mg total) by mouth daily.   solifenacin 10 MG tablet Commonly known as:  VESICARE Take 1 tablet (10 mg total) by  mouth daily.       Allergies:  Allergies  Allergen Reactions  . Bactrim [Sulfamethoxazole-Trimethoprim] Other (See Comments)    Burning to her mouth    Family History: Family History  Problem Relation Age of Onset  . Bladder Cancer Neg Hx   . Kidney cancer Neg Hx     Social History:  reports that she quit smoking about 31 years ago. Her smoking use included cigarettes. She smoked 0.25 packs per day. she has never used smokeless tobacco. She reports that she does not drink alcohol or use drugs.  ROS: UROLOGY Frequent Urination?: No Hard to postpone urination?:  No Burning/pain with urination?: No Get up at night to urinate?: No Leakage of urine?: No Urine stream starts and stops?: No Trouble starting stream?: No Do you have to strain to urinate?: No Blood in urine?: No Urinary tract infection?: No Sexually transmitted disease?: No Injury to kidneys or bladder?: No Painful intercourse?: No Weak stream?: No Currently pregnant?: No Vaginal bleeding?: No Last menstrual period?: n  Gastrointestinal Nausea?: No Vomiting?: No Indigestion/heartburn?: No Diarrhea?: No Constipation?: No  Constitutional Fever: No Night sweats?: No Weight loss?: No Fatigue?: No  Skin Skin rash/lesions?: No Itching?: No  Eyes Blurred vision?: No Double vision?: No  Ears/Nose/Throat Sore throat?: No Sinus problems?: No  Hematologic/Lymphatic Swollen glands?: No Easy bruising?: No  Cardiovascular Leg swelling?: No Chest pain?: No  Respiratory Cough?: No Shortness of breath?: No  Endocrine Excessive thirst?: No  Musculoskeletal Back pain?: No Joint pain?: No  Neurological Headaches?: No Dizziness?: No  Psychologic Depression?: No Anxiety?: No  Physical Exam: BP (!) 152/86 (BP Location: Right Arm, Patient Position: Sitting, Cuff Size: Normal)   Pulse 88   Ht 5\' 1"  (1.549 m)   Wt 167 lb 9.6 oz (76 kg)   BMI 31.67 kg/m   Constitutional:  Alert and oriented, No acute distress.   Laboratory Data: Lab Results  Component Value Date   WBC 6.1 02/04/2015   HGB 12.1 02/04/2015   HCT 36.2 02/04/2015   MCV 83.6 02/04/2015   PLT 270 02/04/2015    Lab Results  Component Value Date   CREATININE 0.69 02/04/2015    No results found for: PSA  No results found for: TESTOSTERONE  No results found for: HGBA1C  Urinalysis    Component Value Date/Time   COLORURINE YELLOW 02/04/2015 1139   APPEARANCEUR Clear 08/21/2016 1528   LABSPEC 1.013 02/04/2015 1139   LABSPEC 1.010 05/26/2014 1545   PHURINE 5.5 02/04/2015 1139    GLUCOSEU Negative 08/21/2016 1528   GLUCOSEU Negative 05/26/2014 1545   HGBUR NEGATIVE 02/04/2015 1139   BILIRUBINUR Negative 08/21/2016 1528   BILIRUBINUR Negative 05/26/2014 1545   KETONESUR 15 (A) 02/04/2015 1139   PROTEINUR Negative 08/21/2016 1528   PROTEINUR NEGATIVE 02/04/2015 1139   NITRITE Negative 08/21/2016 1528   NITRITE NEGATIVE 02/04/2015 1139   LEUKOCYTESUR Trace (A) 08/21/2016 1528   LEUKOCYTESUR 1+ 05/26/2014 1545    Pertinent Imaging: None  Assessment & Plan: The patient primarily has an overactive bladder.  We represcribed the medication with 60 tablets a month with 11 refills and I will reassess her in 1 year  There are no diagnoses linked to this encounter.  No Follow-up on file.  Martina SinnerMACDIARMID,Breelyn Icard A, MD  [facility] [address], [address] [place], [place] [address] [phone]

## 2017-07-20 ENCOUNTER — Other Ambulatory Visit: Payer: Self-pay | Admitting: Sports Medicine

## 2017-07-20 DIAGNOSIS — G8929 Other chronic pain: Secondary | ICD-10-CM

## 2017-07-20 DIAGNOSIS — M25512 Pain in left shoulder: Principal | ICD-10-CM

## 2017-08-24 DIAGNOSIS — M75122 Complete rotator cuff tear or rupture of left shoulder, not specified as traumatic: Secondary | ICD-10-CM | POA: Insufficient documentation

## 2017-08-24 DIAGNOSIS — M24112 Other articular cartilage disorders, left shoulder: Secondary | ICD-10-CM | POA: Insufficient documentation

## 2017-08-24 DIAGNOSIS — S46102A Unspecified injury of muscle, fascia and tendon of long head of biceps, left arm, initial encounter: Secondary | ICD-10-CM | POA: Insufficient documentation

## 2017-08-27 ENCOUNTER — Ambulatory Visit: Payer: BLUE CROSS/BLUE SHIELD

## 2017-09-03 ENCOUNTER — Encounter: Payer: Self-pay | Admitting: Urology

## 2017-09-03 ENCOUNTER — Ambulatory Visit: Payer: BLUE CROSS/BLUE SHIELD | Admitting: Urology

## 2017-09-03 VITALS — BP 101/68 | HR 82 | Ht 61.0 in | Wt 188.8 lb

## 2017-09-03 DIAGNOSIS — N3281 Overactive bladder: Secondary | ICD-10-CM | POA: Diagnosis not present

## 2017-09-03 DIAGNOSIS — N3946 Mixed incontinence: Secondary | ICD-10-CM | POA: Diagnosis not present

## 2017-09-03 DIAGNOSIS — N390 Urinary tract infection, site not specified: Secondary | ICD-10-CM

## 2017-09-03 HISTORY — DX: Urinary tract infection, site not specified: N39.0

## 2017-09-03 LAB — MICROSCOPIC EXAMINATION

## 2017-09-03 LAB — URINALYSIS, COMPLETE
BILIRUBIN UA: NEGATIVE
Glucose, UA: NEGATIVE
KETONES UA: NEGATIVE
Nitrite, UA: POSITIVE — AB
Protein, UA: NEGATIVE
SPEC GRAV UA: 1.015 (ref 1.005–1.030)
UUROB: 0.2 mg/dL (ref 0.2–1.0)
pH, UA: 6.5 (ref 5.0–7.5)

## 2017-09-03 MED ORDER — OXYBUTYNIN CHLORIDE ER 10 MG PO TB24: 10.0000 mg | ORAL_TABLET | Freq: Two times a day (BID) | ORAL | 11 refills | Status: DC

## 2017-09-03 MED ORDER — CIPROFLOXACIN HCL 500 MG PO TABS: 500.0000 mg | ORAL_TABLET | Freq: Two times a day (BID) | ORAL | 0 refills | Status: DC

## 2017-09-03 NOTE — Progress Notes (Signed)
09/03/2017 11:35 AM   Meghan Coleman [birth year] [id number]  Referring provider: Center, [facility] [address] [place], [place] [address]  Chief Complaint  Patient presents with  . Urinary Incontinence    HPI: I was consulted to assess the patient's urinary incontinence worsening over months to years. She leaks with coughing sneezing bending and lifting and also reports urge incontinence and both are significant. She can have mild bedwetting but also significant foot on the floor syndrome. She wears 3 pads per day moderately wet.  She voids every 90 minutes and gets up 3 times per night to urinate.  Mild to moderate grade 2 hypermobility the bladder neck and a negative cough test. Grade 1 cystocele and no rectocele  The patient has mixed incontinence and moderate nighttime symptoms including enuresis. She likely has a significant overactive bladder component. She has moderate frequency and nighttime frequency as well.  On urodynamics she emptied efficiently. Her bladder capacity was 260 mL. Bladder was unstable reaching a pressure of 15 cm water. She leaked a mild amount. She was triggering instability with coughing. Steward Drone felt that she did leak at 80 cm of water at 100 mL during the second filling. She then triggered another contraction. She did not leak at 86 cm water. During voluntary voiding she voided 148 mL with a maximal flow 20 mils per second. Maximum voiding pressure was 17 cm water. She emptied efficiently. EMG activity was normal. Bladder neck descended 1 or 2 cm.   The patient primarily has an overactive bladder with mild stress incontinence by history and perhaps urodynamics. One would need to re-quantitate the stress component if she fails medical and behavioral therapy for the overactive bladder component.  She chose not to see the physical therapist. She is also hoping to be helpedwithout surgery but is willing to do so if needed  for her quality of life  The beta 3 agonist helped for a few weeks but the effect wore off. She is actually doing quite well on Vesicare 5 mg. I gave her prescription a 10 mg.   To clarify I believe the patient failed Vesicare 10 mg samples but was doing great and was dry on oxybutynin ER 10 mg twice a day.  In retrospect she thinks now I am correct  Today She was dry until 3 weeks ago.  She was having some burning and worsening urgency and cloudy and foul-smelling urine.  The cystitis symptoms settle down but she still has the urge incontinence.  Modifying factors: There are no other modifying factors  Associated signs and symptoms: There are no other associated signs and symptoms Aggravating and relieving factors: There are no other aggravating or relieving factors Severity: Moderate Duration: Persistent    PMH: History reviewed. No pertinent past medical history.  Surgical History: Past Surgical History:  Procedure Laterality Date  . COLONOSCOPY    . SHOULDER ARTHROSCOPY WITH OPEN ROTATOR CUFF REPAIR Right 08/24/2015   Procedure: SHOULDER ARTHROSCOPY WITH OPEN ROTATOR CUFF REPAIR;  Surgeon: Christena Flake, MD;  Location: [facility] ORS;  Service: Orthopedics;  Laterality: Right;  . TUBAL LIGATION      Home Medications:  Allergies as of 09/03/2017      Reactions   Bactrim [sulfamethoxazole-trimethoprim] Other (See Comments)   Burning to her mouth      Medication List        Accurate as of 09/03/17 11:35 AM. Always use your most recent med list.  gabapentin 300 MG capsule Commonly known as:  NEURONTIN   naproxen 375 MG tablet Commonly known as:  NAPROSYN Take 375 mg by mouth 2 (two) times daily with a meal.   oxybutynin 10 MG 24 hr tablet Commonly known as:  DITROPAN-XL Take 1 tablet (10 mg total) 2 (two) times daily by mouth.   solifenacin 10 MG tablet Commonly known as:  VESICARE Take 1 tablet (10 mg total) by mouth daily.       Allergies:  Allergies    Allergen Reactions  . Bactrim [Sulfamethoxazole-Trimethoprim] Other (See Comments)    Burning to her mouth    Family History: Family History  Problem Relation Age of Onset  . Bladder Cancer Neg Hx   . Kidney cancer Neg Hx     Social History:  reports that she quit smoking about 32 years ago. Her smoking use included cigarettes. She smoked 0.25 packs per day. She has never used smokeless tobacco. She reports that she does not drink alcohol or use drugs.  ROS: UROLOGY Frequent Urination?: Yes Hard to postpone urination?: No Burning/pain with urination?: Yes Get up at night to urinate?: Yes Leakage of urine?: No Urine stream starts and stops?: No Trouble starting stream?: No Do you have to strain to urinate?: No Blood in urine?: No Urinary tract infection?: No Sexually transmitted disease?: No Injury to kidneys or bladder?: No Painful intercourse?: No Weak stream?: No Currently pregnant?: No Vaginal bleeding?: No Last menstrual period?: n  Gastrointestinal Nausea?: No Vomiting?: No Indigestion/heartburn?: No Diarrhea?: No Constipation?: No  Constitutional Fever: No Night sweats?: No Weight loss?: No Fatigue?: No  Skin Skin rash/lesions?: No Itching?: No  Eyes Blurred vision?: No Double vision?: No  Ears/Nose/Throat Sore throat?: No Sinus problems?: No  Hematologic/Lymphatic Swollen glands?: No Easy bruising?: No  Cardiovascular Leg swelling?: No Chest pain?: No  Respiratory Cough?: No Shortness of breath?: No  Endocrine Excessive thirst?: No  Musculoskeletal Back pain?: No Joint pain?: No  Neurological Headaches?: No Dizziness?: No  Psychologic Depression?: No Anxiety?: No  Physical Exam: BP 101/68 (BP Location: Right Arm, Patient Position: Sitting, Cuff Size: Large)   Pulse 82   Ht 5\' 1"  (1.549 m)   Wt 188 lb 12.8 oz (85.6 kg)   BMI 35.67 kg/m   Constitutional:  Alert and oriented, No acute distress.  Laboratory Data: Lab  Results  Component Value Date   WBC 6.1 02/04/2015   HGB 12.1 02/04/2015   HCT 36.2 02/04/2015   MCV 83.6 02/04/2015   PLT 270 02/04/2015    Lab Results  Component Value Date   CREATININE 0.69 02/04/2015    No results found for: PSA  No results found for: TESTOSTERONE  No results found for: HGBA1C  Urinalysis    Component Value Date/Time   COLORURINE YELLOW 02/04/2015 1139   APPEARANCEUR Clear 08/21/2016 1528   LABSPEC 1.013 02/04/2015 1139   LABSPEC 1.010 05/26/2014 1545   PHURINE 5.5 02/04/2015 1139   GLUCOSEU Negative 08/21/2016 1528   GLUCOSEU Negative 05/26/2014 1545   HGBUR NEGATIVE 02/04/2015 1139   BILIRUBINUR Negative 08/21/2016 1528   BILIRUBINUR Negative 05/26/2014 1545   KETONESUR 15 (A) 02/04/2015 1139   PROTEINUR Negative 08/21/2016 1528   PROTEINUR NEGATIVE 02/04/2015 1139   NITRITE Negative 08/21/2016 1528   NITRITE NEGATIVE 02/04/2015 1139   LEUKOCYTESUR Trace (A) 08/21/2016 1528   LEUKOCYTESUR 1+ 05/26/2014 1545    Pertinent Imaging:   Assessment & Plan: Clinically the patient likely had a urinary tract infection in  the face of well-controlled mixed incontinence on oxybutynin ER 10 mg twice a day.  This prescription was renewed.  I called in ciprofloxacin 250 mg twice a day for 1 week.  She will call if she does not get better.  I sent the urine for culture.  Otherwise she will near  There are no diagnoses linked to this encounter.  No follow-ups on file.  Martina SinnerMACDIARMID,Shneur [place] A, MD  [facility] [address], [address] [place], [place] [address] [phone]

## 2017-09-05 LAB — CULTURE, URINE COMPREHENSIVE

## 2017-09-06 ENCOUNTER — Encounter
Admission: RE | Admit: 2017-09-06 | Discharge: 2017-09-06 | Disposition: A | Discharge status: Home / Self Care | Payer: BLUE CROSS/BLUE SHIELD | Source: Ambulatory Visit | Attending: Surgery | Admitting: Surgery

## 2017-09-06 ENCOUNTER — Other Ambulatory Visit: Payer: Self-pay

## 2017-09-06 HISTORY — DX: Unspecified osteoarthritis, unspecified site: M19.90

## 2017-09-06 HISTORY — DX: Urinary tract infection, site not specified: N39.0

## 2017-09-06 NOTE — Patient Instructions (Signed)
Your procedure is scheduled on: 09-11-17  Report to Same Day Surgery 2nd floor medical mall [facility](Medical Mall Entrance-take elevator on left to 2nd floor.  Check in with surgery information desk.) To find out your arrival time please call [phone] between 1PM - 3PM on 09-10-17  Remember: Instructions that are not followed completely may result in serious medical risk, up to and including death, or upon the discretion of your surgeon and anesthesiologist your surgery may need to be rescheduled.    _x___ 1. Do not eat food after midnight the night before your procedure. You may drink clear liquids up to 2 hours before you are scheduled to arrive at the hospital for your procedure.  Do not drink clear liquids within 2 hours of your scheduled arrival to the hospital.  Clear liquids include  --Water or Apple juice without pulp  --Clear carbohydrate beverage such as ClearFast or Gatorade  --Black Coffee or Clear Tea (No milk, no creamers, do not add anything to the coffee or Tea   No gum chewing or hard candies.     __x__ 2. No Alcohol for 24 hours before or after surgery.   __x__3. No Smoking or e-cigarettes for 24 prior to surgery.  Do not use any chewable tobacco products for at least 6 hour prior to surgery   ____  4. Bring all medications with you on the day of surgery if instructed.    __x__ 5. Notify your doctor if there is any change in your medical condition     (cold, fever, infections).    x___6. On the morning of surgery brush your teeth with toothpaste and water.  You may rinse your mouth with mouth wash if you wish.  Do not swallow any toothpaste or mouthwash.   Do not wear jewelry, make-up, hairpins, clips or nail polish.  Do not wear lotions, powders, or perfumes. You may wear deodorant.  Do not shave 48 hours prior to surgery. Men may shave face and neck.  Do not bring valuables to the hospital.    [facility] is not responsible for any belongings or valuables.       Contacts, dentures or bridgework may not be worn into surgery.  Leave your suitcase in the car. After surgery it may be brought to your room.  For patients admitted to the hospital, discharge time is determined by your treatment team.  _  Patients discharged the day of surgery will not be allowed to drive home.  You will need someone to drive you home and stay with you the night of your procedure.    Please read over the following fact sheets that you were given:   [facility] Preparing for Surgery and or MRSA Information   _x___ TAKE THE FOLLOWING MEDICATION THE MORNING OF SURGERY WITH A SMALL SIP OF WATER. These include:  1. DITROPAN  2.  3.  4.  5.  6.  ____Fleets enema or Magnesium Citrate as directed.   ____ Use CHG Soap or sage wipes as directed on instruction sheet   ____ Use inhalers on the day of surgery and bring to hospital day of surgery  ____ Stop Metformin and Janumet 2 days prior to surgery.    ____ Take 1/2 of usual insulin dose the night before surgery and none on the morning  surgery.   ____ Follow recommendations from Cardiologist, Pulmonologist or PCP regarding stopping Aspirin, Coumadin, Plavix ,Eliquis, Effient, or Pradaxa, and Pletal.  X____Stop Anti-inflammatories such as Advil, Aleve,  Ibuprofen, Motrin, Naproxen, Naprosyn, Goodies powders or aspirin products NOW-OK to take Tylenol    ____ Stop supplements until after surgery.   ____ Bring C-Pap to the hospital.

## 2017-09-10 MED ORDER — CEFAZOLIN SODIUM-DEXTROSE 2-4 GM/100ML-% IV SOLN
2.0000 g | Freq: Once | INTRAVENOUS | Status: AC
  Administered 2017-09-11: 2 g via INTRAVENOUS

## 2017-09-11 ENCOUNTER — Encounter
Admission: RE | Disposition: A | Discharge status: Home / Self Care | Payer: Self-pay | Source: Ambulatory Visit | Attending: Surgery

## 2017-09-11 ENCOUNTER — Ambulatory Visit: Payer: BLUE CROSS/BLUE SHIELD | Admitting: Anesthesiology

## 2017-09-11 ENCOUNTER — Ambulatory Visit
Admission: RE | Admit: 2017-09-11 | Discharge: 2017-09-11 | Disposition: A | Discharge status: Home / Self Care | Payer: BLUE CROSS/BLUE SHIELD | Source: Ambulatory Visit | Attending: Surgery | Admitting: Surgery

## 2017-09-11 ENCOUNTER — Other Ambulatory Visit: Payer: Self-pay

## 2017-09-11 DIAGNOSIS — Z791 Long term (current) use of non-steroidal anti-inflammatories (NSAID): Secondary | ICD-10-CM | POA: Diagnosis not present

## 2017-09-11 DIAGNOSIS — M7542 Impingement syndrome of left shoulder: Secondary | ICD-10-CM | POA: Insufficient documentation

## 2017-09-11 DIAGNOSIS — Z79899 Other long term (current) drug therapy: Secondary | ICD-10-CM | POA: Diagnosis not present

## 2017-09-11 DIAGNOSIS — M75122 Complete rotator cuff tear or rupture of left shoulder, not specified as traumatic: Secondary | ICD-10-CM | POA: Insufficient documentation

## 2017-09-11 DIAGNOSIS — Z87891 Personal history of nicotine dependence: Secondary | ICD-10-CM | POA: Insufficient documentation

## 2017-09-11 DIAGNOSIS — M7522 Bicipital tendinitis, left shoulder: Secondary | ICD-10-CM | POA: Diagnosis not present

## 2017-09-11 HISTORY — PX: SHOULDER ARTHROSCOPY WITH OPEN ROTATOR CUFF REPAIR: SHX6092

## 2017-09-11 SURGERY — ARTHROSCOPY, SHOULDER WITH REPAIR, ROTATOR CUFF, OPEN
Anesthesia: General | Laterality: Left

## 2017-09-11 MED ORDER — METOCLOPRAMIDE HCL 10 MG PO TABS: 5.0000 mg | ORAL_TABLET | Freq: Three times a day (TID) | ORAL | Status: DC | PRN

## 2017-09-11 MED ORDER — BUPIVACAINE LIPOSOME 1.3 % IJ SUSP
INTRAMUSCULAR | Status: AC
  Filled 2017-09-11: qty 20

## 2017-09-11 MED ORDER — ONDANSETRON HCL 4 MG/2ML IJ SOLN
INTRAMUSCULAR | Status: AC
  Filled 2017-09-11: qty 2

## 2017-09-11 MED ORDER — FENTANYL CITRATE (PF) 100 MCG/2ML IJ SOLN
INTRAMUSCULAR | Status: DC | PRN
  Administered 2017-09-11 (×2): 50 ug via INTRAVENOUS

## 2017-09-11 MED ORDER — FENTANYL CITRATE (PF) 100 MCG/2ML IJ SOLN
INTRAMUSCULAR | Status: AC
  Filled 2017-09-11: qty 2

## 2017-09-11 MED ORDER — PROPOFOL 10 MG/ML IV BOLUS
INTRAVENOUS | Status: DC | PRN
  Administered 2017-09-11: 30 mg via INTRAVENOUS
  Administered 2017-09-11: 160 mg via INTRAVENOUS

## 2017-09-11 MED ORDER — CEFAZOLIN SODIUM-DEXTROSE 2-4 GM/100ML-% IV SOLN
INTRAVENOUS | Status: AC
  Filled 2017-09-11: qty 100

## 2017-09-11 MED ORDER — LIDOCAINE HCL (PF) 2 % IJ SOLN
INTRAMUSCULAR | Status: AC
  Filled 2017-09-11: qty 10

## 2017-09-11 MED ORDER — EPINEPHRINE PF 1 MG/ML IJ SOLN
INTRAMUSCULAR | Status: AC
  Filled 2017-09-11: qty 2

## 2017-09-11 MED ORDER — MIDAZOLAM HCL 2 MG/2ML IJ SOLN
1.0000 mg | Freq: Once | INTRAMUSCULAR | Status: AC
  Administered 2017-09-11: 1 mg via INTRAVENOUS

## 2017-09-11 MED ORDER — OXYCODONE HCL 5 MG PO TABS
5.0000 mg | ORAL_TABLET | ORAL | Status: DC | PRN
  Filled 2017-09-11: qty 2

## 2017-09-11 MED ORDER — FAMOTIDINE 20 MG PO TABS
20.0000 mg | ORAL_TABLET | Freq: Once | ORAL | Status: AC
  Administered 2017-09-11: 20 mg via ORAL

## 2017-09-11 MED ORDER — BUPIVACAINE HCL (PF) 0.5 % IJ SOLN
INTRAMUSCULAR | Status: AC
  Filled 2017-09-11: qty 10

## 2017-09-11 MED ORDER — BUPIVACAINE-EPINEPHRINE 0.5% -1:200000 IJ SOLN
INTRAMUSCULAR | Status: DC | PRN
  Administered 2017-09-11: 30 mL

## 2017-09-11 MED ORDER — ONDANSETRON HCL 4 MG/2ML IJ SOLN: 4.0000 mg | Freq: Four times a day (QID) | INTRAMUSCULAR | Status: DC | PRN

## 2017-09-11 MED ORDER — FENTANYL CITRATE (PF) 100 MCG/2ML IJ SOLN
50.0000 ug | Freq: Once | INTRAMUSCULAR | Status: AC
  Administered 2017-09-11: 50 ug via INTRAVENOUS

## 2017-09-11 MED ORDER — DEXAMETHASONE SODIUM PHOSPHATE 10 MG/ML IJ SOLN
INTRAMUSCULAR | Status: AC
  Filled 2017-09-11: qty 1

## 2017-09-11 MED ORDER — LIDOCAINE HCL (CARDIAC) PF 100 MG/5ML IV SOSY
PREFILLED_SYRINGE | INTRAVENOUS | Status: DC | PRN
  Administered 2017-09-11: 60 mg via INTRAVENOUS
  Administered 2017-09-11: 20 mg via INTRAVENOUS

## 2017-09-11 MED ORDER — LIDOCAINE HCL (PF) 1 % IJ SOLN
INTRAMUSCULAR | Status: AC
  Filled 2017-09-11: qty 5

## 2017-09-11 MED ORDER — PHENYLEPHRINE HCL 10 MG/ML IJ SOLN
INTRAMUSCULAR | Status: DC | PRN
  Administered 2017-09-11 (×3): 50 ug via INTRAVENOUS

## 2017-09-11 MED ORDER — ROCURONIUM BROMIDE 50 MG/5ML IV SOLN
INTRAVENOUS | Status: AC
  Filled 2017-09-11: qty 1

## 2017-09-11 MED ORDER — FAMOTIDINE 20 MG PO TABS
ORAL_TABLET | ORAL | Status: AC
  Administered 2017-09-11: 20 mg via ORAL
  Filled 2017-09-11: qty 1

## 2017-09-11 MED ORDER — SODIUM CHLORIDE 0.9 % IV SOLN
INTRAVENOUS | Status: DC | PRN
  Administered 2017-09-11: 20 ug/min via INTRAVENOUS

## 2017-09-11 MED ORDER — DEXAMETHASONE SODIUM PHOSPHATE 10 MG/ML IJ SOLN
INTRAMUSCULAR | Status: DC | PRN
  Administered 2017-09-11: 10 mg via INTRAVENOUS

## 2017-09-11 MED ORDER — ONDANSETRON HCL 4 MG PO TABS: 4.0000 mg | ORAL_TABLET | Freq: Four times a day (QID) | ORAL | Status: DC | PRN

## 2017-09-11 MED ORDER — ONDANSETRON HCL 4 MG/2ML IJ SOLN
INTRAMUSCULAR | Status: DC | PRN
  Administered 2017-09-11: 4 mg via INTRAVENOUS

## 2017-09-11 MED ORDER — LACTATED RINGERS IV SOLN
INTRAVENOUS | Status: DC
  Administered 2017-09-11 (×2): via INTRAVENOUS

## 2017-09-11 MED ORDER — MIDAZOLAM HCL 2 MG/2ML IJ SOLN
INTRAMUSCULAR | Status: DC | PRN
  Administered 2017-09-11 (×2): 1 mg via INTRAVENOUS

## 2017-09-11 MED ORDER — OXYCODONE HCL 5 MG PO TABS: 5.0000 mg | ORAL_TABLET | ORAL | 0 refills | Status: DC | PRN

## 2017-09-11 MED ORDER — BUPIVACAINE-EPINEPHRINE (PF) 0.5% -1:200000 IJ SOLN
INTRAMUSCULAR | Status: AC
  Filled 2017-09-11: qty 30

## 2017-09-11 MED ORDER — ONDANSETRON HCL 4 MG/2ML IJ SOLN: 4.0000 mg | Freq: Once | INTRAMUSCULAR | Status: DC | PRN

## 2017-09-11 MED ORDER — POTASSIUM CHLORIDE IN NACL 20-0.9 MEQ/L-% IV SOLN: INTRAVENOUS | Status: DC

## 2017-09-11 MED ORDER — EPINEPHRINE PF 1 MG/ML IJ SOLN
INTRAMUSCULAR | Status: AC
  Filled 2017-09-11: qty 1

## 2017-09-11 MED ORDER — SUGAMMADEX SODIUM 200 MG/2ML IV SOLN
INTRAVENOUS | Status: AC
  Filled 2017-09-11: qty 2

## 2017-09-11 MED ORDER — FENTANYL CITRATE (PF) 100 MCG/2ML IJ SOLN: 25.0000 ug | INTRAMUSCULAR | Status: DC | PRN

## 2017-09-11 MED ORDER — FENTANYL CITRATE (PF) 100 MCG/2ML IJ SOLN
INTRAMUSCULAR | Status: AC
  Administered 2017-09-11: 50 ug via INTRAVENOUS
  Filled 2017-09-11: qty 2

## 2017-09-11 MED ORDER — MIDAZOLAM HCL 2 MG/2ML IJ SOLN
INTRAMUSCULAR | Status: AC
  Administered 2017-09-11: 1 mg via INTRAVENOUS
  Filled 2017-09-11: qty 2

## 2017-09-11 MED ORDER — MIDAZOLAM HCL 2 MG/2ML IJ SOLN
INTRAMUSCULAR | Status: AC
  Filled 2017-09-11: qty 2

## 2017-09-11 MED ORDER — ROCURONIUM BROMIDE 100 MG/10ML IV SOLN
INTRAVENOUS | Status: DC | PRN
  Administered 2017-09-11: 50 mg via INTRAVENOUS

## 2017-09-11 MED ORDER — SUGAMMADEX SODIUM 200 MG/2ML IV SOLN
INTRAVENOUS | Status: DC | PRN
  Administered 2017-09-11: 175 mg via INTRAVENOUS

## 2017-09-11 MED ORDER — LACTATED RINGERS IV SOLN
INTRAVENOUS | Status: DC | PRN
  Administered 2017-09-11: 2 mL

## 2017-09-11 MED ORDER — METOCLOPRAMIDE HCL 5 MG/ML IJ SOLN: 5.0000 mg | Freq: Three times a day (TID) | INTRAMUSCULAR | Status: DC | PRN

## 2017-09-11 SURGICAL SUPPLY — 52 items
ANCHOR JUGGERKNOT WTAP NDL 2.9 (Anchor) ×6 IMPLANT
ANCHOR SUT QUATTRO KNTLS 4.5 (Anchor) ×4 IMPLANT
ANCHOR TENDON REGENETEN (Staple) ×2 IMPLANT
ANCHORS BONE REGENETEN (Anchor) ×2 IMPLANT
BIT DRILL JUGRKNT W/NDL BIT2.9 (DRILL) IMPLANT
BLADE FULL RADIUS 3.5 (BLADE) ×1 IMPLANT
BUR ACROMIONIZER 4.0 (BURR) ×1 IMPLANT
BURR OVAL 8 FLU 4.0MM X 13CM (MISCELLANEOUS) ×1
BURR OVAL 8 FLU 4.0X13 (MISCELLANEOUS) ×1 IMPLANT
CANNULA SHAVER 8MMX76MM (CANNULA) ×1 IMPLANT
CHLORAPREP W/TINT 26ML (MISCELLANEOUS) ×3 IMPLANT
COVER MAYO STAND STRL (DRAPES) ×3 IMPLANT
CUTTER BONE 4.0MM X 13CM (MISCELLANEOUS) ×2 IMPLANT
DRAPE IMP U-DRAPE 54X76 (DRAPES) ×6 IMPLANT
DRILL JUGGERKNOT W/NDL BIT 2.9 (DRILL) ×3
DW OUTFLOW CASSETTE/TUBE SET (MISCELLANEOUS) ×2 IMPLANT
ELECT REM PT RETURN 9FT ADLT (ELECTROSURGICAL) ×3
ELECTRODE REM PT RTRN 9FT ADLT (ELECTROSURGICAL) ×1 IMPLANT
GAUZE PETRO XEROFOAM 1X8 (MISCELLANEOUS) ×3 IMPLANT
GAUZE SPONGE 4X4 12PLY STRL (GAUZE/BANDAGES/DRESSINGS) ×3 IMPLANT
GLOVE BIO SURGEON STRL SZ7.5 (GLOVE) ×6 IMPLANT
GLOVE BIO SURGEON STRL SZ8 (GLOVE) ×6 IMPLANT
GLOVE BIOGEL PI IND STRL 8 (GLOVE) ×1 IMPLANT
GLOVE BIOGEL PI INDICATOR 8 (GLOVE) ×2
GLOVE INDICATOR 8.0 STRL GRN (GLOVE) ×3 IMPLANT
GOWN STRL REUS W/ TWL LRG LVL3 (GOWN DISPOSABLE) ×1 IMPLANT
GOWN STRL REUS W/ TWL XL LVL3 (GOWN DISPOSABLE) ×1 IMPLANT
GOWN STRL REUS W/TWL LRG LVL3 (GOWN DISPOSABLE) ×2
GOWN STRL REUS W/TWL XL LVL3 (GOWN DISPOSABLE) ×2
GRASPER SUT 15 45D LOW PRO (SUTURE) IMPLANT
IMPLANT REGENETEN MEDIUM (Shoulder) ×2 IMPLANT
IV LACTATED RINGER IRRG 3000ML (IV SOLUTION) ×4
IV LR IRRIG 3000ML ARTHROMATIC (IV SOLUTION) ×2 IMPLANT
MANIFOLD NEPTUNE II (INSTRUMENTS) ×3 IMPLANT
MASK FACE SPIDER DISP (MASK) ×3 IMPLANT
MAT BLUE FLOOR 46X72 FLO (MISCELLANEOUS) ×3 IMPLANT
PACK ARTHROSCOPY SHOULDER (MISCELLANEOUS) ×3 IMPLANT
PORT APPOLLO RF 90DEGREE MULTI (SURGICAL WAND) ×2 IMPLANT
SLING ARM LRG DEEP (SOFTGOODS) ×1 IMPLANT
SLING ULTRA II LG (MISCELLANEOUS) ×3 IMPLANT
STAPLER SKIN PROX 35W (STAPLE) ×3 IMPLANT
STRAP SAFETY 5IN WIDE (MISCELLANEOUS) ×3 IMPLANT
SUT ETHIBOND 0 MO6 C/R (SUTURE) ×3 IMPLANT
SUT VIC AB 2-0 CT1 27 (SUTURE) ×4
SUT VIC AB 2-0 CT1 TAPERPNT 27 (SUTURE) ×2 IMPLANT
TAPE MICROFOAM 4IN (TAPE) ×3 IMPLANT
TUBING ARTHRO INFLOW-ONLY STRL (TUBING) ×1 IMPLANT
TUBING CONNECTING 10 (TUBING) ×1 IMPLANT
TUBING CONNECTING 10' (TUBING)
TUBING REDEUCE PAT W/CON 8IN (MISCELLANEOUS) ×2 IMPLANT
TUBING REDEUCE PUMP W/CON 8IN (MISCELLANEOUS) ×2 IMPLANT
WAND HAND CNTRL MULTIVAC 90 (MISCELLANEOUS) ×1 IMPLANT

## 2017-09-11 NOTE — Transfer of Care (Signed)
Immediate Anesthesia Transfer of Care Note  Patient: Meghan Coleman  Procedure(s) Performed: SHOULDER ARTHROSCOPY WITH OPEN ROTATOR CUFF REPAIR (Left )  Patient Location: PACU  Anesthesia Type:General  Level of Consciousness: sedated  Airway & Oxygen Therapy: Patient Spontanous Breathing and Patient connected to face mask oxygen  Post-op Assessment: Report given to RN and Post -op Vital signs reviewed and stable  Post vital signs: Reviewed and stable  Last Vitals:  Vitals Value Taken Time  BP    Temp    Pulse 98 09/11/2017  3:42 PM  Resp 14 09/11/2017  3:42 PM  SpO2 100 % 09/11/2017  3:42 PM  Vitals shown include unvalidated device data.  Last Pain:  Vitals:   09/11/17 1154  TempSrc: Temporal  PainSc: 5          Complications: No apparent anesthesia complications

## 2017-09-11 NOTE — OR Nursing (Signed)
Discharge instructions discussed with pt and husband. Both voice understanding. 

## 2017-09-11 NOTE — Discharge Instructions (Addendum)
Orthopedic discharge instructions: Keep dressing dry and intact.  May shower after dressing changed on post-op day #4 (Saturday).  Cover staples with Band-Aids after drying off. Apply ice frequently to shoulder. Take ibuprofen 600-800 mg TID with meals for 7-10 days, then as necessary. Take oxycodone as prescribed when needed.  May supplement with ES Tylenol if necessary. Keep shoulder immobilizer on at all times except may remove for bathing purposes. Follow-up in 10-14 days or as scheduled.     Interscalene Nerve Block with Exparel  1.  For your surgery you have received an Interscalene Nerve Block with Exparel. 2. Nerve Blocks affect many types of nerves, including nerves that control movement, pain and normal sensation.  You may experience feelings such as numbness, tingling, heaviness, weakness or the inability to move your arm or the feeling or sensation that your arm has "fallen asleep". 3. A nerve block with Exparel can last up to 5 days.  Usually the weakness wears off first.  The tingling and heaviness usually wear off next.  Finally you may start to notice pain.  Keep in mind that this may occur in any order.  Once a nerve block starts to wear off it is usually completely gone within 60 minutes. 4. ISNB may cause mild shortness of breath, a hoarse voice, blurry vision, unequal pupils, or drooping of the face on the same side as the nerve block.  These symptoms will usually resolve with the numbness.  Very rarely the procedure itself can cause mild seizures. 5. If needed, your surgeon will give you a prescription for pain medication.  It will take about 60 minutes for the oral pain medication to become fully effective.  So, it is recommended that you start taking this medication before the nerve block first begins to wear off, or when you first begin to feel discomfort. 6. Take your pain medication only as prescribed.  Pain medication can cause sedation and decrease your breathing if you  take more than you need for the level of pain that you have. 7. Nausea is a common side effect of many pain medications.  You may want to eat something before taking your pain medicine to prevent nausea. 8. After an Interscalene nerve block, you cannot feel pain, pressure or extremes in temperature in the effected arm.  Because your arm is numb it is at an increased risk for injury.  To decrease the possibility of injury, please practice the following:  a. While you are awake change the position of your arm frequently to prevent too much pressure on any one area for prolonged periods of time. b.  If you have a cast or tight dressing, check the color or your fingers every couple of hours.  Call your surgeon with the appearance of any discoloration (white or blue). c. If you are given a sling to wear before you go home, please wear it  at all times until the block has completely worn off.  Do not get up at night without your sling. d. Please contact [facility] or your surgeon if you do not begin to regain sensation after 7 days from the surgery.  Anesthesia may be contacted by calling the Same Day Surgery Department, Mon. through Fri., 6 am to 4 pm at [phone].   e. If you experience any other problems or concerns, please contact your surgeon's office. f. If you experience severe or prolonged shortness of breath go to the nearest emergency department.

## 2017-09-11 NOTE — Anesthesia Procedure Notes (Signed)
Procedure Name: Intubation Date/Time: 09/11/2017 1:23 PM Performed by: Wess Botts, RN Pre-anesthesia Checklist: Patient identified, Emergency Drugs available, Suction available, Patient being monitored and Timeout performed Patient Re-evaluated:Patient Re-evaluated prior to induction Oxygen Delivery Method: Circle system utilized Preoxygenation: Pre-oxygenation with 100% oxygen Induction Type: IV induction Ventilation: Mask ventilation without difficulty Laryngoscope Size: Mac and 4 Grade View: Grade I Tube type: Oral Tube size: 7.0 mm Number of attempts: 1 Airway Equipment and Method: Stylet Placement Confirmation: ETT inserted through vocal cords under direct vision,  positive ETCO2 and breath sounds checked- equal and bilateral Secured at: 22 cm Tube secured with: Tape Dental Injury: Teeth and Oropharynx as per pre-operative assessment

## 2017-09-11 NOTE — Op Note (Signed)
09/11/2017  3:38 PM  Patient:   Meghan Coleman  Pre-Op Diagnosis:   Impingement/tendinopathy with nontraumatic full-thickness rotator cuff tear, left shoulder.  Post-Op Diagnosis:   Impingement/tendinopathy with rotator cuff tear and biceps tendinopathy, left shoulder.  Procedure:   Limited arthroscopic debridement, arthroscopic subacromial decompression, mini-open rotator cuff repair with Katrinka Blazing & Nephew Regeneten patch augmentation, and mini-open biceps tenodesis, left shoulder.  Anesthesia:   General endotracheal with interscalene block using Exparel placed preoperatively by the anesthesiologist.  Surgeon:   Maryagnes Amos, MD  Assistant:   Janell Quiet, PA-C  Findings:   As above. There was a full-thickness tear involving the mid and posterior insertional fibers of the supraspinatus tendon. The remainder the rotator cuff was in excellent condition. There was moderate fraying of the labrum anteriorly, superiorly and posteriorly without frank detachment from the glenoid. The articular surfaces of the glenoid and humerus both were in excellent condition. The biceps tendon demonstrated moderate fraying and partial-thickness tearing.  Complications:   None  Fluids:   1100 cc  Estimated blood loss:   10 cc  Tourniquet time:   None  Drains:   None  Closure:   Staples      Brief clinical note:   The patient is a 57 year old female with a history of left shoulder pain. The patient's symptoms have progressed despite medications, activity modification, etc. The patient's history and examination are consistent with impingement/tendinopathy with a rotator cuff tear. These findings were confirmed by MRI scan. The patient presents at this time for definitive management of these shoulder symptoms.  Procedure:   The patient underwent placement of an interscalene block using Exparel by the anesthesiologist in the preoperative holding area before being brought into the operating room and lain in  the supine position. The patient then underwent general endotracheal intubation and anesthesia before being repositioned in the beach chair position using the beach chair positioner. The left shoulder and upper extremity were prepped with ChloraPrep solution before being draped sterilely. Preoperative antibiotics were administered. A timeout was performed to confirm the proper surgical site before the expected portal sites and incision site were injected with 0.5% Sensorcaine with epinephrine. A posterior portal was created and the glenohumeral joint thoroughly inspected with the findings as described above. An anterior portal was created using an outside-in technique. The labrum and rotator cuff were further probed, again confirming the above-noted findings. The areas of labral fraying and synovitis were debrided back to stable margins using the full-radius resector. The ArthroCare wand was inserted and used to release the biceps tendon from its labral anchor. It also was used to obtain hemostasis as well as to "anneal" the labrum superiorly and anteriorly. The instruments were removed from the joint after suctioning the excess fluid.  The camera was repositioned through the posterior portal into the subacromial space. A separate lateral portal was created using an outside-in technique. The 3.5 mm full-radius resector was introduced and used to perform a subtotal bursectomy. The ArthroCare wand was then inserted and used to remove the periosteal tissue off the undersurface of the anterior third of the acromion as well as to recess the coracoacromial ligament from its attachment along the anterior and lateral margins of the acromion. The 4.0 mm acromionizing bur was introduced and used to complete the decompression by removing the undersurface of the anterior third of the acromion. The full radius resector was reintroduced to remove any residual bony debris before the ArthroCare wand was reintroduced to obtain  hemostasis. The  instruments were then removed from the subacromial space after suctioning the excess fluid.  An approximately 4-5 cm incision was made over the anterolateral aspect of the shoulder beginning at the anterolateral corner of the acromion and extending distally in line with the bicipital groove. This incision was carried down through the subcutaneous tissues to expose the deltoid fascia. The raphae between the anterior and middle thirds was identified and this plane developed to provide access into the subacromial space. Additional bursal tissues were debrided sharply using Metzenbaum scissors. The rotator cuff tear was readily identified. The margins were debrided sharply with a #15 blade and the exposed greater tuberosity roughened with a rongeur. The tear was repaired using two Biomet 2.9 mm JuggerKnot anchors. These sutures were then brought back laterally and secured using two [place] QuatroLink anchors to create a two-layer closure. An apparent watertight closure was obtained. Given that the rotator cuff tissue was somewhat tenuous and that the footprint was not fully covered, it was felt best to reinforce this repair with a medium Smith & Nephew Regeneten patch. This was positioned appropriately and stabilized using eight soft tissue tacks and three bone staples.  The bicipital groove was identified by palpation and opened for 1-1.5 cm. The biceps tendon stump was retrieved through this defect. The floor of the bicipital groove was roughened with a curet before another Biomet 2.9 mm JuggerKnot anchor was inserted. Both sets of sutures were passed through the biceps tendon and tied securely to effect the tenodesis. The bicipital sheath was reapproximated using two #0 Ethibond interrupted sutures, incorporating the biceps tendon to further reinforce the tenodesis.  The wound was copiously irrigated with sterile saline solution before the deltoid raphae was reapproximated using 2-0 Vicryl  interrupted sutures. The subcutaneous tissues were closed in two layers using 2-0 Vicryl interrupted sutures before the skin was closed using staples. The portal sites also were closed using staples. A sterile bulky dressing was applied to the shoulder before the arm was placed into a shoulder immobilizer. The patient was then awakened, extubated, and returned to the recovery room in satisfactory condition after tolerating the procedure well.

## 2017-09-11 NOTE — Anesthesia Post-op Follow-up Note (Signed)
Anesthesia QCDR form completed.        

## 2017-09-11 NOTE — H&P (Signed)
Paper H&P to be scanned into permanent record. H&P reviewed and patient re-examined. No changes. 

## 2017-09-11 NOTE — Anesthesia Postprocedure Evaluation (Signed)
Anesthesia Post Note  Patient: Meghan ParisCynthia K Coleman  Procedure(s) Performed: SHOULDER ARTHROSCOPY WITH OPEN ROTATOR CUFF REPAIR (Left )  Patient location during evaluation: PACU Anesthesia Type: General Level of consciousness: awake and alert Pain management: pain level controlled Vital Signs Assessment: post-procedure vital signs reviewed and stable Respiratory status: spontaneous breathing and respiratory function stable Cardiovascular status: stable Anesthetic complications: no     Last Vitals:  Vitals:   09/11/17 1542 09/11/17 1543  BP: (!) 182/74 (!) 182/74  Pulse: 98 100  Resp: 14 16  Temp: (!) 36.1 C   SpO2: 100% 100%    Last Pain:  Vitals:   09/11/17 1542  TempSrc:   PainSc: Asleep                 KEPHART,WILLIAM K

## 2017-09-11 NOTE — Anesthesia Preprocedure Evaluation (Signed)
Anesthesia Evaluation  Patient identified by MRN, date of birth, ID band Patient awake    Reviewed: Allergy & Precautions, NPO status , Patient's Chart, lab work & pertinent test results  History of Anesthesia Complications Negative for: history of anesthetic complications  Airway Mallampati: II       Dental   Pulmonary neg sleep apnea, neg COPD, former smoker,           Cardiovascular (-) hypertension(-) Past MI and (-) CHF (-) dysrhythmias (-) Valvular Problems/Murmurs     Neuro/Psych neg Seizures    GI/Hepatic Neg liver ROS, neg GERD  ,  Endo/Other  neg diabetes  Renal/GU negative Renal ROS     Musculoskeletal   Abdominal   Peds  Hematology   Anesthesia Other Findings   Reproductive/Obstetrics                             Anesthesia Physical Anesthesia Plan  ASA: II  Anesthesia Plan: General   Post-op Pain Management:  Regional for Post-op pain   Induction: Intravenous  PONV Risk Score and Plan: 3 and Ondansetron, Dexamethasone and Midazolam  Airway Management Planned: Oral ETT  Additional Equipment:   Intra-op Plan:   Post-operative Plan:   Informed Consent: I have reviewed the patients History and Physical, chart, labs and discussed the procedure including the risks, benefits and alternatives for the proposed anesthesia with the patient or authorized representative who has indicated his/her understanding and acceptance.     Plan Discussed with:   Anesthesia Plan Comments:         Anesthesia Quick Evaluation

## 2017-09-12 ENCOUNTER — Encounter: Payer: Self-pay | Admitting: Surgery

## 2017-12-24 DIAGNOSIS — M7502 Adhesive capsulitis of left shoulder: Secondary | ICD-10-CM | POA: Insufficient documentation

## 2017-12-25 ENCOUNTER — Other Ambulatory Visit: Payer: Self-pay

## 2017-12-25 ENCOUNTER — Encounter: Payer: Self-pay | Admitting: *Deleted

## 2017-12-26 ENCOUNTER — Ambulatory Visit: Payer: BLUE CROSS/BLUE SHIELD | Admitting: Anesthesiology

## 2017-12-26 ENCOUNTER — Ambulatory Visit
Admission: RE | Admit: 2017-12-26 | Discharge: 2017-12-26 | Disposition: A | Discharge status: Home / Self Care | Payer: BLUE CROSS/BLUE SHIELD | Source: Ambulatory Visit | Attending: Surgery | Admitting: Surgery

## 2017-12-26 ENCOUNTER — Encounter
Admission: RE | Disposition: A | Discharge status: Home / Self Care | Payer: Self-pay | Source: Ambulatory Visit | Attending: Surgery

## 2017-12-26 DIAGNOSIS — Z87891 Personal history of nicotine dependence: Secondary | ICD-10-CM | POA: Diagnosis not present

## 2017-12-26 DIAGNOSIS — M7502 Adhesive capsulitis of left shoulder: Secondary | ICD-10-CM | POA: Diagnosis not present

## 2017-12-26 HISTORY — DX: Dizziness and giddiness: R42

## 2017-12-26 HISTORY — PX: SHOULDER CLOSED REDUCTION: SHX1051

## 2017-12-26 HISTORY — DX: Presence of dental prosthetic device (complete) (partial): Z97.2

## 2017-12-26 SURGERY — MANIPULATION, JOINT, SHOULDER, WITH ANESTHESIA
Anesthesia: Regional | Site: Shoulder | Laterality: Left

## 2017-12-26 MED ORDER — BUPIVACAINE-EPINEPHRINE 0.25% -1:200000 IJ SOLN
INTRAMUSCULAR | Status: DC | PRN
  Administered 2017-12-26: 9 mL

## 2017-12-26 MED ORDER — ACETAMINOPHEN 160 MG/5ML PO SOLN: 325.0000 mg | ORAL | Status: DC | PRN

## 2017-12-26 MED ORDER — ACETAMINOPHEN 325 MG PO TABS: 325.0000 mg | ORAL_TABLET | ORAL | Status: DC | PRN

## 2017-12-26 MED ORDER — ONDANSETRON HCL 4 MG/2ML IJ SOLN: 4.0000 mg | Freq: Four times a day (QID) | INTRAMUSCULAR | Status: DC | PRN

## 2017-12-26 MED ORDER — OXYCODONE HCL 5 MG/5ML PO SOLN: 5.0000 mg | Freq: Once | ORAL | Status: DC | PRN

## 2017-12-26 MED ORDER — MIDAZOLAM HCL 2 MG/2ML IJ SOLN
INTRAMUSCULAR | Status: DC | PRN
  Administered 2017-12-26: 2 mg via INTRAVENOUS

## 2017-12-26 MED ORDER — FENTANYL CITRATE (PF) 100 MCG/2ML IJ SOLN
INTRAMUSCULAR | Status: DC | PRN
  Administered 2017-12-26: 50 ug via INTRAVENOUS

## 2017-12-26 MED ORDER — TRIAMCINOLONE ACETONIDE 40 MG/ML IJ SUSP
INTRAMUSCULAR | Status: DC | PRN
  Administered 2017-12-26: 1 mL via INTRAMUSCULAR

## 2017-12-26 MED ORDER — ROPIVACAINE HCL 5 MG/ML IJ SOLN
INTRAMUSCULAR | Status: DC | PRN
  Administered 2017-12-26: 30 mL

## 2017-12-26 MED ORDER — OXYCODONE HCL 5 MG PO TABS: 5.0000 mg | ORAL_TABLET | ORAL | Status: DC | PRN

## 2017-12-26 MED ORDER — LACTATED RINGERS IV SOLN
INTRAVENOUS | Status: DC
  Administered 2017-12-26: 13:00:00 via INTRAVENOUS

## 2017-12-26 MED ORDER — FENTANYL CITRATE (PF) 100 MCG/2ML IJ SOLN: 25.0000 ug | INTRAMUSCULAR | Status: DC | PRN

## 2017-12-26 MED ORDER — METOCLOPRAMIDE HCL 5 MG/ML IJ SOLN: 5.0000 mg | Freq: Three times a day (TID) | INTRAMUSCULAR | Status: DC | PRN

## 2017-12-26 MED ORDER — ONDANSETRON HCL 4 MG PO TABS: 4.0000 mg | ORAL_TABLET | Freq: Four times a day (QID) | ORAL | Status: DC | PRN

## 2017-12-26 MED ORDER — ONDANSETRON HCL 4 MG/2ML IJ SOLN: 4.0000 mg | Freq: Once | INTRAMUSCULAR | Status: DC | PRN

## 2017-12-26 MED ORDER — METOCLOPRAMIDE HCL 5 MG PO TABS: 5.0000 mg | ORAL_TABLET | Freq: Three times a day (TID) | ORAL | Status: DC | PRN

## 2017-12-26 MED ORDER — POTASSIUM CHLORIDE IN NACL 20-0.9 MEQ/L-% IV SOLN: INTRAVENOUS | Status: DC

## 2017-12-26 MED ORDER — DEXAMETHASONE SODIUM PHOSPHATE 4 MG/ML IJ SOLN
INTRAMUSCULAR | Status: DC | PRN
  Administered 2017-12-26: 4 mg via INTRAVENOUS

## 2017-12-26 MED ORDER — ONDANSETRON HCL 4 MG/2ML IJ SOLN
INTRAMUSCULAR | Status: DC | PRN
  Administered 2017-12-26: 4 mg via INTRAVENOUS

## 2017-12-26 MED ORDER — OXYCODONE HCL 5 MG PO TABS: 5.0000 mg | ORAL_TABLET | ORAL | 0 refills | Status: DC | PRN

## 2017-12-26 MED ORDER — OXYCODONE HCL 5 MG PO TABS: 5.0000 mg | ORAL_TABLET | Freq: Once | ORAL | Status: DC | PRN

## 2017-12-26 MED ORDER — GLYCOPYRROLATE 0.2 MG/ML IJ SOLN
INTRAMUSCULAR | Status: DC | PRN
  Administered 2017-12-26: 0.1 mg via INTRAVENOUS

## 2017-12-26 MED ORDER — LACTATED RINGERS IV SOLN: 500.0000 mL | INTRAVENOUS | Status: DC

## 2017-12-26 SURGICAL SUPPLY — 5 items
BNDG ADH 2 X3.75 FABRIC TAN LF (GAUZE/BANDAGES/DRESSINGS) ×3 IMPLANT
NEEDLE HYPO 21X1.5 SAFETY (NEEDLE) IMPLANT
PAD ALCOHOL SWAB (MISCELLANEOUS) IMPLANT
SLING ARM M TX990204 (SOFTGOODS) ×3 IMPLANT
SYR 10ML LL (SYRINGE) IMPLANT

## 2017-12-26 NOTE — Anesthesia Postprocedure Evaluation (Signed)
Anesthesia Post Note  Patient: Meghan Coleman  Procedure(s) Performed: CLOSED MANIPULATION SHOULDER WITH STEROID INJECTION (Left Shoulder)  Patient location during evaluation: PACU Anesthesia Type: Regional Level of consciousness: awake and alert, oriented and patient cooperative Pain management: pain level controlled Vital Signs Assessment: post-procedure vital signs reviewed and stable Respiratory status: spontaneous breathing, nonlabored ventilation and respiratory function stable Cardiovascular status: blood pressure returned to baseline and stable Postop Assessment: adequate PO intake Anesthetic complications: no    Reed Breech

## 2017-12-26 NOTE — Anesthesia Procedure Notes (Signed)
Anesthesia Regional Block: Interscalene brachial plexus block   Pre-Anesthetic Checklist: ,, timeout performed, Correct Patient, Correct Site, Correct Laterality, Correct Procedure, Correct Position, site marked, Risks and benefits discussed,  Surgical consent,  Pre-op evaluation,  At surgeon's request and post-op pain management  Laterality: Left  Prep: chloraprep       Needles:  Injection technique: Single-shot  Needle Type: Stimiplex     Needle Length: 4cm  Needle Gauge: 21     Additional Needles:   Procedures:,,,, ultrasound used (permanent image in chart),,,,  Narrative:  Start time: 12/26/2017 1:12 PM End time: 12/26/2017 1:19 PM Injection made incrementally with aspirations every 5 mL.  Performed by: Personally  Anesthesiologist: Reed Breech, MD  Additional Notes: Functioning IV was confirmed and monitors applied. Ultrasound guidance: relevant anatomy identified, needle position confirmed, local anesthetic spread visualized around nerve(s)., vascular puncture avoided.  Image printed for medical record.  Negative aspiration and no paresthesias; incremental administration of local anesthetic. The patient tolerated the procedure well. Vitals signes recorded in RN notes.

## 2017-12-26 NOTE — Anesthesia Procedure Notes (Signed)
Procedure Name: General with mask airway Performed by: Gatlyn Lipari, CRNA Pre-anesthesia Checklist: Patient identified, Patient being monitored, Emergency Drugs available, Timeout performed and Suction available Patient Re-evaluated:Patient Re-evaluated prior to induction Oxygen Delivery Method: Circle system utilized Preoxygenation: Pre-oxygenation with 100% oxygen Induction Type: Combination inhalational/ intravenous induction Ventilation: Mask ventilation without difficulty Dental Injury: Teeth and Oropharynx as per pre-operative assessment        

## 2017-12-26 NOTE — Op Note (Signed)
12/26/2017  2:52 PM  Patient:   Meghan Coleman  Pre-Op Diagnosis:   Secondary adhesive capsulitis, left shoulder.  Post-Op Diagnosis:   Same  Procedure:   Manipulation under anesthesia with steroid injection, left shoulder.  Surgeon:   Maryagnes Amos, MD  Assistant:   Joni Fears, PA-S  Anesthesia:   IV sedation with interscalene block placed preoperatively by anesthesiologist.  Findings:   As above. Prior to manipulation, the left shoulder could be forward flexed to 110 and abducted to 100. At 90 of abduction, the shoulder could be externally rotated to 60 and internally rotated to 30. Following manipulation, the shoulder could be forward flexed to 165, abducted to 160 and, at 90 of abduction, externally rotated to 85 and internally rotated to 60.  Complications:   None  EBL:   0 cc  Fluids:   400 cc crystalloid  TT:   None  Drains:   None  Closure:   None  Brief Clinical Note:   The patient is a 57 year old female who is now 3.5 months status post a left shoulder arthroscopy with debridement, decompression, rotator cuff repair, and biceps tenodesis. Despite extensive physical therapy, the patient continues to have difficulty with pain and with regaining shoulder range of motion. The patient's history and examination are consistent with adhesive capsulitis. The patient presents at this time for a manipulation under anesthesia with steroid injection of the left shoulder.  Procedure:   The patient underwent placement of an interscalene block in the preoperative holding area before being brought into the operating room and lain in the supine position. After adequate IV sedation was achieved, a timeout was performed to verify the correct surgical site. The left shoulder was gently manipulated in both abduction and external rotation, as well as adduction and internal rotation. Several palpable and audible pops were heard as the scar tissue released, permitting full range  of motion of the shoulder. The glenohumeral joint was injected sterilely using 1 cc of Kenalog-40 and 9 cc of 0.25% Sensorcaine with epinephrine before the patient was placed into a sling. The patient was then awakened and returned to the recovery room in satisfactory condition after tolerating the procedure well.

## 2017-12-26 NOTE — Transfer of Care (Signed)
Immediate Anesthesia Transfer of Care Note  Patient: Meghan Coleman  Procedure(s) Performed: CLOSED MANIPULATION SHOULDER WITH STEROID INJECTION (Left Shoulder)  Patient Location: PACU  Anesthesia Type: General, Regional  Level of Consciousness: awake, alert  and patient cooperative  Airway and Oxygen Therapy: Patient Spontanous Breathing and Patient connected to supplemental oxygen  Post-op Assessment: Post-op Vital signs reviewed, Patient's Cardiovascular Status Stable, Respiratory Function Stable, Patent Airway and No signs of Nausea or vomiting  Post-op Vital Signs: Reviewed and stable  Complications: No apparent anesthesia complications

## 2017-12-26 NOTE — H&P (Signed)
Paper H&P to be scanned into permanent record. H&P reviewed and patient re-examined. No changes. 

## 2017-12-26 NOTE — Discharge Instructions (Addendum)
General Anesthesia, Adult, Care After These instructions provide you with information about caring for yourself after your procedure. Your health care provider may also give you more specific instructions. Your treatment has been planned according to current medical practices, but problems sometimes occur. Call your health care provider if you have any problems or questions after your procedure. What can I expect after the procedure? After the procedure, it is common to have:  Vomiting.  A sore throat.  Mental slowness.  It is common to feel:  Nauseous.  Cold or shivery.  Sleepy.  Tired.  Sore or achy, even in parts of your body where you did not have surgery.  Follow these instructions at home: For at least 24 hours after the procedure:  Do not: ? Participate in activities where you could fall or become injured. ? Drive. ? Use heavy machinery. ? Drink alcohol. ? Take sleeping pills or medicines that cause drowsiness. ? Make important decisions or sign legal documents. ? Take care of children on your own.  Rest. Eating and drinking  If you vomit, drink water, juice, or soup when you can drink without vomiting.  Drink enough fluid to keep your urine clear or pale yellow.  Make sure you have little or no nausea before eating solid foods.  Follow the diet recommended by your health care provider. General instructions  Have a responsible adult stay with you until you are awake and alert.  Return to your normal activities as told by your health care provider. Ask your health care provider what activities are safe for you.  Take over-the-counter and prescription medicines only as told by your health care provider.  If you smoke, do not smoke without supervision.  Keep all follow-up visits as told by your health care provider. This is important. Contact a health care provider if:  You continue to have nausea or vomiting at home, and medicines are not helpful.  You  cannot drink fluids or start eating again.  You cannot urinate after 8-12 hours.  You develop a skin rash.  You have fever.  You have increasing redness at the site of your procedure. Get help right away if:  You have difficulty breathing.  You have chest pain.  You have unexpected bleeding.  You feel that you are having a life-threatening or urgent problem. This information is not intended to replace advice given to you by your health care provider. Make sure you discuss any questions you have with your health care provider. Document Released: 05/22/2000 Document Revised: 07/19/2015 Document Reviewed: 01/28/2015 Elsevier Interactive Patient Education  2018 [employer].   Orthopedic discharge instructions: Keep arm in sling until sensation returns to hand once block wears off. May shower immediately. Apply ice to affected area frequently. Take Naprosyn 500 mg BID with meals for 7-10 days, then as necessary. Take ES Tylenol or pain medication as prescribed when needed.  Start physical therapy as scheduled. Return for follow-up in 10-14 days or as scheduled.

## 2017-12-26 NOTE — Progress Notes (Signed)
Assisted Meghan Coleman, ANMD with left, ultrasound guided, interscalene  block. Side rails up, monitors on throughout procedure. See vital signs in flow sheet. Tolerated Procedure well.  

## 2017-12-26 NOTE — Anesthesia Preprocedure Evaluation (Signed)
Anesthesia Evaluation  Patient identified by MRN, date of birth, ID band Patient awake    Reviewed: Allergy & Precautions, NPO status , Patient's Chart, lab work & pertinent test results  History of Anesthesia Complications Negative for: history of anesthetic complications  Airway Mallampati: III  TM Distance: >3 FB Neck ROM: Full    Dental  (+) Upper Dentures, Partial Lower   Pulmonary former smoker (quit 1987),    Pulmonary exam normal breath sounds clear to auscultation       Cardiovascular Exercise Tolerance: Good negative cardio ROS Normal cardiovascular exam Rhythm:Regular Rate:Normal     Neuro/Psych Vertigo     GI/Hepatic negative GI ROS,   Endo/Other  negative endocrine ROS  Renal/GU negative Renal ROS     Musculoskeletal  (+) Arthritis , Osteoarthritis,    Abdominal   Peds  Hematology negative hematology ROS (+)   Anesthesia Other Findings   Reproductive/Obstetrics                             Anesthesia Physical Anesthesia Plan  ASA: II  Anesthesia Plan: General and Regional   Post-op Pain Management:  Regional for Post-op pain and GA combined w/ Regional for post-op pain   Induction: Intravenous  PONV Risk Score and Plan: 3 and TIVA, Midazolam and Propofol infusion  Airway Management Planned: Natural Airway  Additional Equipment:   Intra-op Plan:   Post-operative Plan:   Informed Consent: I have reviewed the patients History and Physical, chart, labs and discussed the procedure including the risks, benefits and alternatives for the proposed anesthesia with the patient or authorized representative who has indicated his/her understanding and acceptance.     Plan Discussed with: CRNA  Anesthesia Plan Comments:         Anesthesia Quick Evaluation

## 2017-12-27 ENCOUNTER — Encounter: Payer: Self-pay | Admitting: Surgery

## 2017-12-28 DIAGNOSIS — Z9889 Other specified postprocedural states: Secondary | ICD-10-CM | POA: Insufficient documentation

## 2018-01-07 ENCOUNTER — Ambulatory Visit: Payer: BLUE CROSS/BLUE SHIELD | Admitting: Urology

## 2018-03-04 ENCOUNTER — Encounter: Payer: Self-pay | Admitting: Urology

## 2018-03-04 ENCOUNTER — Ambulatory Visit: Payer: BLUE CROSS/BLUE SHIELD | Admitting: Urology

## 2018-03-04 VITALS — BP 128/70 | HR 101 | Ht 61.0 in | Wt 198.0 lb

## 2018-03-04 DIAGNOSIS — N3946 Mixed incontinence: Secondary | ICD-10-CM | POA: Diagnosis not present

## 2018-03-04 DIAGNOSIS — N3281 Overactive bladder: Secondary | ICD-10-CM | POA: Diagnosis not present

## 2018-03-04 MED ORDER — OXYBUTYNIN CHLORIDE ER 10 MG PO TB24: 10.0000 mg | ORAL_TABLET | Freq: Once | ORAL | 11 refills | Status: AC

## 2018-03-04 NOTE — Progress Notes (Signed)
03/04/2018 8:23 AM   Meghan Coleman February 02, 1961 [id number]  Referring provider: [facility] [address] [place], [place] [address]  Chief Complaint  Patient presents with  . Over Active Bladder    follow up    HPI: I was consulted to assess the patient's urinary incontinence worsening over months to years. She leaks with coughing sneezing bending and lifting and also reports urge incontinence and both are significant. She can have mild bedwetting but also significant foot on the floor syndrome. She wears 3 pads per day moderately wet.  She voids every 90 minutes and gets up 3 times per night to urinate.  Mild to moderate grade 2 hypermobility the bladder neck and a negative cough test. Grade 1 cystocele and no rectocele  The patient has mixed incontinence and moderate nighttime symptoms including enuresis. She likely has a significant overactive bladder component. She has moderate frequency and nighttime frequency as well.  On urodynamics she emptied efficiently. Her bladder capacity was 260 mL. Bladder was unstable reaching a pressure of 15 cm water. She leaked a mild amount. She was triggering instability with coughing. Steward Drone felt that she did leak at 80 cm of water at 100 mL during the second filling. She then triggered another contraction. She did not leak at 86 cm water. During voluntary voiding she voided 148 mL with a maximal flow 20 mils per second. Maximum voiding pressure was 17 cm water. She emptied efficiently. EMG activity was normal. Bladder neck descended 1 or 2 cm.   The patient primarily has an overactive bladder with mild stress incontinence by history and perhaps urodynamics. One would need to re-quantitate the stress component if she fails medical and behavioral therapy for the overactive bladder component.  She chose not to see the physical therapist. She is also hoping to be helpedwithout surgery but is willing to do  so if needed for her quality of life  The beta 3 agonist helped for a few weeks but the effect wore off. She is actually doing quite well on Vesicare 5 mg. I gave her prescription a 10 mg.  To clarify I believe the patient failed Vesicare 10 mg samples but was doing great and was dry on oxybutynin ER 10 mg twice a day.  In retrospect she thinks now I am correct  last She was dry until 3 weeks ago.  She was having some burning and worsening urgency and cloudy and foul-smelling urine.  The cystitis symptoms settle down but she still has the urge incontinence.  Clinically the patient likely had a urinary tract infection in the face of well-controlled mixed incontinence on oxybutynin ER 10 mg twice a day.  This prescription was renewed.  I called in ciprofloxacin 250 mg twice a day for 1 week.   TOday No bladder infections.  Frequency stable.  Urge incontinence much better.  Prescription renewed.  Very pleased.  I will see in a year     PMH: Past Medical History:  Diagnosis Date  . Arthritis   . UTI (urinary tract infection) 09/03/2017  . Vertigo    no episodes in over 3 years  . Wears dentures    full upper, partial lower    Surgical History: Past Surgical History:  Procedure Laterality Date  . COLONOSCOPY    . SHOULDER ARTHROSCOPY WITH OPEN ROTATOR CUFF REPAIR Right 08/24/2015   Procedure: SHOULDER ARTHROSCOPY WITH OPEN ROTATOR CUFF REPAIR;  Surgeon: Christena Flake, MD;  Location: [facility]  ORS;  Service: Orthopedics;  Laterality: Right;  . SHOULDER ARTHROSCOPY WITH OPEN ROTATOR CUFF REPAIR Left 09/11/2017   Procedure: SHOULDER ARTHROSCOPY WITH OPEN ROTATOR CUFF REPAIR;  Surgeon: Christena FlakePoggi, John J, MD;  Location: [facility] ORS;  Service: Orthopedics;  Laterality: Left;  . SHOULDER CLOSED REDUCTION Left 12/26/2017   Procedure: CLOSED MANIPULATION SHOULDER WITH STEROID INJECTION;  Surgeon: Christena FlakePoggi, John J, MD;  [facility];  Service: Orthopedics;  Laterality: Left;  . TUBAL LIGATION       Home Medications:  Allergies as of 03/04/2018      Reactions   Bactrim [sulfamethoxazole-trimethoprim] Other (See Comments)   Burning to her mouth      Medication List       Accurate as of March 04, 2018  8:23 AM. Always use your most recent med list.        naproxen 500 MG tablet Commonly known as:  NAPROSYN Take 500 mg by mouth 2 (two) times daily as needed.   oxybutynin 10 MG 24 hr tablet Commonly known as:  DITROPAN-XL Take 1 tablet (10 mg total) by mouth 2 (two) times daily.   oxyCODONE 5 MG immediate release tablet Commonly known as:  ROXICODONE Take 1-2 tablets (5-10 mg total) by mouth every 4 (four) hours as needed for severe pain.       Allergies:  Allergies  Allergen Reactions  . Bactrim [Sulfamethoxazole-Trimethoprim] Other (See Comments)    Burning to her mouth    Family History: Family History  Problem Relation Age of Onset  . Bladder Cancer Neg Hx   . Kidney cancer Neg Hx     Social History:  reports that she quit smoking about 32 years ago. Her smoking use included cigarettes. She has a 0.25 pack-year smoking history. She has never used smokeless tobacco. She reports that she does not drink alcohol or use drugs.  ROS:                                        Physical Exam: BP 128/70 (BP Location: Left Arm, Patient Position: Sitting)   Pulse (!) 101   Ht 5\' 1"  (1.549 m)   Wt 198 lb (89.8 kg)   BMI 37.41 kg/m   Constitutional:  Alert and oriented, No acute distress.   Laboratory Data: Lab Results  Component Value Date   WBC 6.1 02/04/2015   HGB 12.1 02/04/2015   HCT 36.2 02/04/2015   MCV 83.6 02/04/2015   PLT 270 02/04/2015    Lab Results  Component Value Date   CREATININE 0.69 02/04/2015    No results found for: PSA  No results found for: TESTOSTERONE  No results found for: HGBA1C  Urinalysis    Component Value Date/Time   COLORURINE YELLOW 02/04/2015 1139   APPEARANCEUR Cloudy (A) 09/03/2017  1131   LABSPEC 1.013 02/04/2015 1139   LABSPEC 1.010 05/26/2014 1545   PHURINE 5.5 02/04/2015 1139   GLUCOSEU Negative 09/03/2017 1131   GLUCOSEU Negative 05/26/2014 1545   HGBUR NEGATIVE 02/04/2015 1139   BILIRUBINUR Negative 09/03/2017 1131   BILIRUBINUR Negative 05/26/2014 1545   KETONESUR 15 (A) 02/04/2015 1139   PROTEINUR Negative 09/03/2017 1131   PROTEINUR NEGATIVE 02/04/2015 1139   NITRITE Positive (A) 09/03/2017 1131   NITRITE NEGATIVE 02/04/2015 1139   LEUKOCYTESUR 2+ (A) 09/03/2017 1131   LEUKOCYTESUR 1+ 05/26/2014 1545    Pertinent Imaging:   Assessment &  Plan: Reassess in 1 year  There are no diagnoses linked to this encounter.  No follow-ups on file.  Martina Sinner, MD  [facility] [address], [address] [place], [place] [address] [phone]

## 2018-03-18 ENCOUNTER — Other Ambulatory Visit: Payer: Self-pay | Admitting: Urology

## 2018-03-18 MED ORDER — CIPROFLOXACIN HCL 250 MG PO TABS: 250.0000 mg | ORAL_TABLET | Freq: Two times a day (BID) | ORAL | 0 refills | Status: DC

## 2018-03-18 NOTE — Telephone Encounter (Signed)
Pt states that she needs a refill for her UTI meds. She doesn't know the name of it.

## 2018-03-18 NOTE — Telephone Encounter (Signed)
Spoke with patient regarding her UTI med request.  Per last office note Dr Misty Stanley was supposed to send in cipro for the patient but it does not look like it was sent the pharmacy.  Please advise on refill.

## 2018-04-17 ENCOUNTER — Encounter
Admission: RE | Admit: 2018-04-17 | Discharge: 2018-04-17 | Disposition: A | Discharge status: Home / Self Care | Payer: BLUE CROSS/BLUE SHIELD | Source: Ambulatory Visit | Attending: Surgery | Admitting: Surgery

## 2018-04-17 ENCOUNTER — Other Ambulatory Visit: Payer: Self-pay

## 2018-04-17 NOTE — Patient Instructions (Signed)
Your procedure is scheduled on: 04-30-18  Report to Same Day Surgery 2nd floor medical mall [facility] Entrance-take elevator on left to 2nd floor.  Check in with surgery information desk.) To find out your arrival time please call [phone] between 1PM - 3PM on 04-29-18   Remember: Instructions that are not followed completely may result in serious medical risk, up to and including death, or upon the discretion of your surgeon and anesthesiologist your surgery may need to be rescheduled.    _x___ 1. Do not eat food after midnight the night before your procedure. You may drink clear liquids up to 2 hours before you are scheduled to arrive at the hospital for your procedure.  Do not drink clear liquids within 2 hours of your scheduled arrival to the hospital.  Clear liquids include  --Water or Apple juice without pulp  --Clear carbohydrate beverage such as ClearFast or Gatorade  --Black Coffee or Clear Tea (No milk, no creamers, do not add anything to the coffee or Tea   ____Ensure clear carbohydrate drink on the way to the hospital for bariatric patients  ____Ensure clear carbohydrate drink 3 hours before surgery for Dr Rutherford Nail patients if physician instructed.   No gum chewing or hard candies.     __x__ 2. No Alcohol for 24 hours before or after surgery.   __x__3. No Smoking or e-cigarettes for 24 prior to surgery.  Do not use any chewable tobacco products for at least 6 hour prior to surgery   ____  4. Bring all medications with you on the day of surgery if instructed.    __x__ 5. Notify your doctor if there is any change in your medical condition     (cold, fever, infections).    x___6. On the morning of surgery brush your teeth with toothpaste and water.  You may rinse your mouth with mouth wash if you wish.  Do not swallow any toothpaste or mouthwash.   Do not wear jewelry, make-up, hairpins, clips or nail polish.  Do not wear lotions, powders, or perfumes. You may wear  deodorant.  Do not shave 48 hours prior to surgery. Men may shave face and neck.  Do not bring valuables to the hospital.    [facility] is not responsible for any belongings or valuables.               Contacts, dentures or bridgework may not be worn into surgery.  Leave your suitcase in the car. After surgery it may be brought to your room.  For patients admitted to the hospital, discharge time is determined by your treatment team.  _  Patients discharged the day of surgery will not be allowed to drive home.  You will need someone to drive you home and stay with you the night of your procedure.    Please read over the following fact sheets that you were given:   [facility] Preparing for Surgery   ____ Take anti-hypertensive listed below, cardiac, seizure, asthma, anti-reflux and psychiatric medicines. These include:  1. NONE  2.  3.  4.  5.  6.  ____Fleets enema or Magnesium Citrate as directed.   ____ Use CHG Soap or sage wipes as directed on instruction sheet   ____ Use inhalers on the day of surgery and bring to hospital day of surgery  ____ Stop Metformin and Janumet 2 days prior to surgery.    ____ Take 1/2 of usual insulin dose the night before surgery and  none on the morning surgery.   ____ Follow recommendations from Cardiologist, Pulmonologist or PCP regarding stopping Aspirin, Coumadin, Plavix ,Eliquis, Effient, or Pradaxa, and Pletal.  X____Stop Anti-inflammatories such as Advil, Aleve, Ibuprofen, Motrin, Naproxen, Naprosyn, Goodies powders or aspirin products 7 DAYS PRIOR TO SURGERY-OK to take Tylenol    ____ Stop supplements until after surgery.     ____ Bring C-Pap to the hospital.

## 2018-04-29 MED ORDER — CEFAZOLIN SODIUM-DEXTROSE 2-4 GM/100ML-% IV SOLN
2.0000 g | Freq: Once | INTRAVENOUS | Status: AC
  Administered 2018-04-30: 2 g via INTRAVENOUS

## 2018-04-30 ENCOUNTER — Encounter
Admission: RE | Disposition: A | Discharge status: Home / Self Care | Payer: Self-pay | Source: Home / Self Care | Attending: Surgery

## 2018-04-30 ENCOUNTER — Encounter: Payer: Self-pay | Admitting: *Deleted

## 2018-04-30 ENCOUNTER — Ambulatory Visit: Payer: No Typology Code available for payment source | Admitting: Anesthesiology

## 2018-04-30 ENCOUNTER — Other Ambulatory Visit: Payer: Self-pay

## 2018-04-30 ENCOUNTER — Ambulatory Visit
Admission: RE | Admit: 2018-04-30 | Discharge: 2018-04-30 | Disposition: A | Discharge status: Home / Self Care | Payer: No Typology Code available for payment source | Attending: Surgery | Admitting: Surgery

## 2018-04-30 DIAGNOSIS — Z6837 Body mass index (BMI) 37.0-37.9, adult: Secondary | ICD-10-CM | POA: Diagnosis not present

## 2018-04-30 DIAGNOSIS — Z882 Allergy status to sulfonamides status: Secondary | ICD-10-CM | POA: Insufficient documentation

## 2018-04-30 DIAGNOSIS — M75112 Incomplete rotator cuff tear or rupture of left shoulder, not specified as traumatic: Secondary | ICD-10-CM | POA: Insufficient documentation

## 2018-04-30 DIAGNOSIS — Z809 Family history of malignant neoplasm, unspecified: Secondary | ICD-10-CM | POA: Diagnosis not present

## 2018-04-30 DIAGNOSIS — E669 Obesity, unspecified: Secondary | ICD-10-CM | POA: Insufficient documentation

## 2018-04-30 DIAGNOSIS — Z79899 Other long term (current) drug therapy: Secondary | ICD-10-CM | POA: Diagnosis not present

## 2018-04-30 DIAGNOSIS — Z87891 Personal history of nicotine dependence: Secondary | ICD-10-CM | POA: Insufficient documentation

## 2018-04-30 HISTORY — PX: SHOULDER ARTHROSCOPY WITH SUBACROMIAL DECOMPRESSION AND BICEP TENDON REPAIR: SHX5689

## 2018-04-30 SURGERY — SHOULDER ARTHROSCOPY WITH SUBACROMIAL DECOMPRESSION AND BICEP TENDON REPAIR
Anesthesia: Regional | Laterality: Left

## 2018-04-30 MED ORDER — BUPIVACAINE HCL (PF) 0.5 % IJ SOLN
INTRAMUSCULAR | Status: AC
  Filled 2018-04-30: qty 10

## 2018-04-30 MED ORDER — ROCURONIUM BROMIDE 100 MG/10ML IV SOLN
INTRAVENOUS | Status: DC | PRN
  Administered 2018-04-30: 50 mg via INTRAVENOUS

## 2018-04-30 MED ORDER — DEXAMETHASONE SODIUM PHOSPHATE 10 MG/ML IJ SOLN
INTRAMUSCULAR | Status: AC
  Filled 2018-04-30: qty 1

## 2018-04-30 MED ORDER — PROPOFOL 10 MG/ML IV BOLUS
INTRAVENOUS | Status: AC
  Filled 2018-04-30: qty 20

## 2018-04-30 MED ORDER — ONDANSETRON HCL 4 MG/2ML IJ SOLN: 4.0000 mg | Freq: Four times a day (QID) | INTRAMUSCULAR | Status: DC | PRN

## 2018-04-30 MED ORDER — LACTATED RINGERS IV SOLN
INTRAVENOUS | Status: DC | PRN
  Administered 2018-04-30: 2400 mL

## 2018-04-30 MED ORDER — FENTANYL CITRATE (PF) 100 MCG/2ML IJ SOLN
50.0000 ug | Freq: Once | INTRAMUSCULAR | Status: AC
  Administered 2018-04-30: 50 ug via INTRAVENOUS

## 2018-04-30 MED ORDER — BUPIVACAINE-EPINEPHRINE 0.5% -1:200000 IJ SOLN
INTRAMUSCULAR | Status: DC | PRN
  Administered 2018-04-30: 30 mL

## 2018-04-30 MED ORDER — ONDANSETRON HCL 4 MG/2ML IJ SOLN
INTRAMUSCULAR | Status: AC
  Filled 2018-04-30: qty 2

## 2018-04-30 MED ORDER — FENTANYL CITRATE (PF) 100 MCG/2ML IJ SOLN
INTRAMUSCULAR | Status: AC
  Administered 2018-04-30: 50 ug via INTRAVENOUS
  Filled 2018-04-30: qty 2

## 2018-04-30 MED ORDER — HYDROMORPHONE HCL 1 MG/ML IJ SOLN: 0.2500 mg | INTRAMUSCULAR | Status: DC | PRN

## 2018-04-30 MED ORDER — LIDOCAINE HCL (PF) 1 % IJ SOLN
INTRAMUSCULAR | Status: AC
  Filled 2018-04-30: qty 5

## 2018-04-30 MED ORDER — OXYCODONE HCL 5 MG PO TABS
ORAL_TABLET | ORAL | Status: AC
  Filled 2018-04-30: qty 1

## 2018-04-30 MED ORDER — OXYCODONE HCL 5 MG PO TABS: 5.0000 mg | ORAL_TABLET | ORAL | 0 refills | Status: DC | PRN

## 2018-04-30 MED ORDER — ROCURONIUM BROMIDE 50 MG/5ML IV SOLN
INTRAVENOUS | Status: AC
  Filled 2018-04-30: qty 1

## 2018-04-30 MED ORDER — LIDOCAINE HCL (PF) 2 % IJ SOLN
INTRAMUSCULAR | Status: AC
  Filled 2018-04-30: qty 10

## 2018-04-30 MED ORDER — METOCLOPRAMIDE HCL 10 MG PO TABS: 5.0000 mg | ORAL_TABLET | Freq: Three times a day (TID) | ORAL | Status: DC | PRN

## 2018-04-30 MED ORDER — LIDOCAINE HCL (CARDIAC) PF 100 MG/5ML IV SOSY
PREFILLED_SYRINGE | INTRAVENOUS | Status: DC | PRN
  Administered 2018-04-30: 100 mg via INTRAVENOUS

## 2018-04-30 MED ORDER — DEXAMETHASONE SODIUM PHOSPHATE 10 MG/ML IJ SOLN
INTRAMUSCULAR | Status: DC | PRN
  Administered 2018-04-30: 10 mg via INTRAVENOUS

## 2018-04-30 MED ORDER — PROPOFOL 10 MG/ML IV BOLUS
INTRAVENOUS | Status: DC | PRN
  Administered 2018-04-30: 150 mg via INTRAVENOUS

## 2018-04-30 MED ORDER — SUGAMMADEX SODIUM 200 MG/2ML IV SOLN
INTRAVENOUS | Status: AC
  Filled 2018-04-30: qty 2

## 2018-04-30 MED ORDER — SUGAMMADEX SODIUM 200 MG/2ML IV SOLN
INTRAVENOUS | Status: DC | PRN
  Administered 2018-04-30: 180 mg via INTRAVENOUS

## 2018-04-30 MED ORDER — MIDAZOLAM HCL 2 MG/2ML IJ SOLN
INTRAMUSCULAR | Status: AC
  Administered 2018-04-30: 1 mg via INTRAVENOUS
  Filled 2018-04-30: qty 2

## 2018-04-30 MED ORDER — ONDANSETRON HCL 4 MG/2ML IJ SOLN
INTRAMUSCULAR | Status: DC | PRN
  Administered 2018-04-30: 4 mg via INTRAVENOUS

## 2018-04-30 MED ORDER — ONDANSETRON HCL 4 MG PO TABS: 4.0000 mg | ORAL_TABLET | Freq: Four times a day (QID) | ORAL | Status: DC | PRN

## 2018-04-30 MED ORDER — MIDAZOLAM HCL 2 MG/2ML IJ SOLN
INTRAMUSCULAR | Status: AC
  Filled 2018-04-30: qty 2

## 2018-04-30 MED ORDER — FAMOTIDINE 20 MG PO TABS
20.0000 mg | ORAL_TABLET | Freq: Once | ORAL | Status: AC
  Administered 2018-04-30: 20 mg via ORAL

## 2018-04-30 MED ORDER — FENTANYL CITRATE (PF) 100 MCG/2ML IJ SOLN
INTRAMUSCULAR | Status: AC
  Filled 2018-04-30: qty 2

## 2018-04-30 MED ORDER — CEFAZOLIN SODIUM-DEXTROSE 2-4 GM/100ML-% IV SOLN
INTRAVENOUS | Status: AC
  Filled 2018-04-30: qty 100

## 2018-04-30 MED ORDER — POTASSIUM CHLORIDE IN NACL 20-0.9 MEQ/L-% IV SOLN: INTRAVENOUS | Status: DC

## 2018-04-30 MED ORDER — OXYCODONE HCL 5 MG PO TABS
5.0000 mg | ORAL_TABLET | ORAL | Status: DC | PRN
  Administered 2018-04-30: 5 mg via ORAL

## 2018-04-30 MED ORDER — SUCCINYLCHOLINE CHLORIDE 20 MG/ML IJ SOLN
INTRAMUSCULAR | Status: AC
  Filled 2018-04-30: qty 1

## 2018-04-30 MED ORDER — BUPIVACAINE HCL (PF) 0.5 % IJ SOLN
INTRAMUSCULAR | Status: DC | PRN
  Administered 2018-04-30: 10 mL

## 2018-04-30 MED ORDER — LIDOCAINE HCL (PF) 1 % IJ SOLN
INTRAMUSCULAR | Status: DC | PRN
  Administered 2018-04-30: 1 mL

## 2018-04-30 MED ORDER — BUPIVACAINE LIPOSOME 1.3 % IJ SUSP
INTRAMUSCULAR | Status: DC | PRN
  Administered 2018-04-30: 20 mL

## 2018-04-30 MED ORDER — MIDAZOLAM HCL 2 MG/2ML IJ SOLN
1.0000 mg | Freq: Once | INTRAMUSCULAR | Status: AC
  Administered 2018-04-30: 1 mg via INTRAVENOUS

## 2018-04-30 MED ORDER — IPRATROPIUM-ALBUTEROL 0.5-2.5 (3) MG/3ML IN SOLN
3.0000 mL | Freq: Once | RESPIRATORY_TRACT | Status: AC
  Administered 2018-04-30: 3 mL via RESPIRATORY_TRACT

## 2018-04-30 MED ORDER — IPRATROPIUM-ALBUTEROL 0.5-2.5 (3) MG/3ML IN SOLN
RESPIRATORY_TRACT | Status: AC
  Administered 2018-04-30: 3 mL via RESPIRATORY_TRACT
  Filled 2018-04-30: qty 3

## 2018-04-30 MED ORDER — BUPIVACAINE LIPOSOME 1.3 % IJ SUSP
INTRAMUSCULAR | Status: AC
  Filled 2018-04-30: qty 20

## 2018-04-30 MED ORDER — LACTATED RINGERS IV SOLN
INTRAVENOUS | Status: DC
  Administered 2018-04-30: 12:00:00 via INTRAVENOUS

## 2018-04-30 MED ORDER — METOCLOPRAMIDE HCL 5 MG/ML IJ SOLN: 5.0000 mg | Freq: Three times a day (TID) | INTRAMUSCULAR | Status: DC | PRN

## 2018-04-30 MED ORDER — FAMOTIDINE 20 MG PO TABS
ORAL_TABLET | ORAL | Status: AC
  Administered 2018-04-30: 20 mg via ORAL
  Filled 2018-04-30: qty 1

## 2018-04-30 MED ORDER — EPINEPHRINE PF 1 MG/ML IJ SOLN
INTRAMUSCULAR | Status: AC
  Filled 2018-04-30: qty 3

## 2018-04-30 MED ORDER — ACETAMINOPHEN 10 MG/ML IV SOLN
INTRAVENOUS | Status: DC | PRN
  Administered 2018-04-30: 1000 mg via INTRAVENOUS

## 2018-04-30 MED ORDER — MIDAZOLAM HCL 2 MG/2ML IJ SOLN
INTRAMUSCULAR | Status: DC | PRN
  Administered 2018-04-30: 2 mg via INTRAVENOUS

## 2018-04-30 MED ORDER — FENTANYL CITRATE (PF) 100 MCG/2ML IJ SOLN
INTRAMUSCULAR | Status: DC | PRN
  Administered 2018-04-30 (×2): 50 ug via INTRAVENOUS

## 2018-04-30 MED ORDER — ACETAMINOPHEN 10 MG/ML IV SOLN
INTRAVENOUS | Status: AC
  Filled 2018-04-30: qty 100

## 2018-04-30 SURGICAL SUPPLY — 44 items
ANCHOR JUGGERKNOT WTAP NDL 2.9 (Anchor) ×4 IMPLANT
ANCHOR SUT QUATTRO KNTLS 4.5 (Anchor) ×4 IMPLANT
BIT DRILL JUGRKNT W/NDL BIT2.9 (DRILL) IMPLANT
BLADE FULL RADIUS 3.5 (BLADE) ×3 IMPLANT
BUR ACROMIONIZER 4.0 (BURR) ×3 IMPLANT
CANNULA SHAVER 8MMX76MM (CANNULA) ×3 IMPLANT
CHLORAPREP W/TINT 26ML (MISCELLANEOUS) ×5 IMPLANT
COVER MAYO STAND STRL (DRAPES) ×3 IMPLANT
COVER WAND RF STERILE (DRAPES) ×1 IMPLANT
DRAPE IMP U-DRAPE 54X76 (DRAPES) ×6 IMPLANT
DRILL JUGGERKNOT W/NDL BIT 2.9 (DRILL) ×3
ELECT REM PT RETURN 9FT ADLT (ELECTROSURGICAL) ×3
ELECTRODE REM PT RTRN 9FT ADLT (ELECTROSURGICAL) ×1 IMPLANT
GAUZE PETRO XEROFOAM 1X8 (MISCELLANEOUS) ×3 IMPLANT
GAUZE SPONGE 4X4 12PLY STRL (GAUZE/BANDAGES/DRESSINGS) ×3 IMPLANT
GLOVE BIO SURGEON STRL SZ7.5 (GLOVE) ×6 IMPLANT
GLOVE BIO SURGEON STRL SZ8 (GLOVE) ×6 IMPLANT
GLOVE BIOGEL PI IND STRL 8 (GLOVE) ×1 IMPLANT
GLOVE BIOGEL PI INDICATOR 8 (GLOVE) ×2
GLOVE INDICATOR 8.0 STRL GRN (GLOVE) ×3 IMPLANT
GOWN STRL REUS W/ TWL LRG LVL3 (GOWN DISPOSABLE) ×1 IMPLANT
GOWN STRL REUS W/ TWL XL LVL3 (GOWN DISPOSABLE) ×1 IMPLANT
GOWN STRL REUS W/TWL LRG LVL3 (GOWN DISPOSABLE) ×2
GOWN STRL REUS W/TWL XL LVL3 (GOWN DISPOSABLE) ×2
GRASPER SUT 15 45D LOW PRO (SUTURE) IMPLANT
IV LACTATED RINGER IRRG 3000ML (IV SOLUTION) ×4
IV LR IRRIG 3000ML ARTHROMATIC (IV SOLUTION) ×2 IMPLANT
MANIFOLD NEPTUNE II (INSTRUMENTS) ×3 IMPLANT
MASK FACE SPIDER DISP (MASK) ×3 IMPLANT
MAT ABSORB  FLUID 56X50 GRAY (MISCELLANEOUS) ×2
MAT ABSORB FLUID 56X50 GRAY (MISCELLANEOUS) ×1 IMPLANT
PACK ARTHROSCOPY SHOULDER (MISCELLANEOUS) ×3 IMPLANT
SLING ARM LRG DEEP (SOFTGOODS) ×1 IMPLANT
SLING ULTRA II LG (MISCELLANEOUS) ×1 IMPLANT
STAPLER SKIN PROX 35W (STAPLE) ×3 IMPLANT
STRAP SAFETY 5IN WIDE (MISCELLANEOUS) ×3 IMPLANT
SUT ETHIBOND 0 MO6 C/R (SUTURE) ×3 IMPLANT
SUT VIC AB 2-0 CT1 27 (SUTURE) ×4
SUT VIC AB 2-0 CT1 TAPERPNT 27 (SUTURE) ×2 IMPLANT
TAPE MICROFOAM 4IN (TAPE) ×3 IMPLANT
TUBING ARTHRO INFLOW-ONLY STRL (TUBING) ×3 IMPLANT
TUBING CONNECTING 10 (TUBING) ×2 IMPLANT
TUBING CONNECTING 10' (TUBING) ×1
WAND WEREWOLF FLOW 90D (MISCELLANEOUS) ×3 IMPLANT

## 2018-04-30 NOTE — H&P (Signed)
Paper H&P to be scanned into permanent record. H&P reviewed and patient re-examined. No changes. 

## 2018-04-30 NOTE — Anesthesia Procedure Notes (Signed)
Procedure Name: Intubation Date/Time: 04/30/2018 2:52 PM Performed by: Aline Brochure, CRNA Pre-anesthesia Checklist: Patient identified, Emergency Drugs available, Suction available and Patient being monitored Patient Re-evaluated:Patient Re-evaluated prior to induction Oxygen Delivery Method: Circle system utilized Preoxygenation: Pre-oxygenation with 100% oxygen Induction Type: IV induction Ventilation: Mask ventilation without difficulty Laryngoscope Size: Mac and 3 Grade View: Grade I Tube type: Oral Tube size: 7.0 mm Number of attempts: 1 Airway Equipment and Method: Stylet Placement Confirmation: ETT inserted through vocal cords under direct vision and positive ETCO2 Secured at: 20 cm Tube secured with: Tape Dental Injury: Teeth and Oropharynx as per pre-operative assessment

## 2018-04-30 NOTE — Anesthesia Post-op Follow-up Note (Signed)
Anesthesia QCDR form completed.        

## 2018-04-30 NOTE — Anesthesia Procedure Notes (Addendum)
Anesthesia Regional Block: Interscalene brachial plexus block   Pre-Anesthetic Checklist: ,, timeout performed, Correct Patient, Correct Site, Correct Laterality, Correct Procedure, Correct Position, site marked, Risks and benefits discussed,  Surgical consent,  Pre-op evaluation,  At surgeon's request and post-op pain management  Laterality: Left  Prep: chloraprep       Needles:  Injection technique: Single-shot  Needle Type: Stimiplex     Needle Length: 9cm  Needle Gauge: 20     Additional Needles:   Narrative:  Start time: 04/30/2018 1:30 PM End time: 04/30/2018 1:45 PM  Performed by: Personally  Anesthesiologist: Jovita Gamma, MD  Additional Notes: Negative aspiration.  Negative paresthesia on injection.  Dose given in divided aliquots under ultrasound guidance. Of note, patient reported burning/tingling in left hand for one week after her previous block.  Discussed risks and benefits including nerve injury, and patient still wished to proceed.

## 2018-04-30 NOTE — Transfer of Care (Signed)
Immediate Anesthesia Transfer of Care Note  Patient: Meghan Coleman  Procedure(s) Performed: SHOULDER ARTHROSCOPY WITH DEBRIDEMENT, DECOMPRESSION AND REPAIR OF RECURRENT ROTATOR CUFF TEAR-LEFT (Left )  Patient Location: PACU  Anesthesia Type:General  Level of Consciousness: awake  Airway & Oxygen Therapy: Patient connected to face mask oxygen  Post-op Assessment: Post -op Vital signs reviewed and stable  Post vital signs: stable  Last Vitals:  Vitals Value Taken Time  BP 136/77 04/30/2018  4:32 PM  Temp    Pulse 107 04/30/2018  4:42 PM  Resp 20 04/30/2018  4:42 PM  SpO2 92 % 04/30/2018  4:42 PM  Vitals shown include unvalidated device data.  Last Pain:  Vitals:   04/30/18 1307  TempSrc:   PainSc: 0-No pain         Complications: No apparent anesthesia complications

## 2018-04-30 NOTE — Discharge Instructions (Addendum)
Interscalene Nerve Block with Exparel  1.  For your surgery you have received an Interscalene Nerve Block with Exparel. 2. Nerve Blocks affect many types of nerves, including nerves that control movement, pain and normal sensation.  You may experience feelings such as numbness, tingling, heaviness, weakness or the inability to move your arm or the feeling or sensation that your arm has "fallen asleep". 3. A nerve block with Exparel can last up to 5 days.  Usually the weakness wears off first.  The tingling and heaviness usually wear off next.  Finally you may start to notice pain.  Keep in mind that this may occur in any order.  Once a nerve block starts to wear off it is usually completely gone within 60 minutes. 4. ISNB may cause mild shortness of breath, a hoarse voice, blurry vision, unequal pupils, or drooping of the face on the same side as the nerve block.  These symptoms will usually resolve with the numbness.  Very rarely the procedure itself can cause mild seizures. 5. If needed, your surgeon will give you a prescription for pain medication.  It will take about 60 minutes for the oral pain medication to become fully effective.  So, it is recommended that you start taking this medication before the nerve block first begins to wear off, or when you first begin to feel discomfort. 6. Take your pain medication only as prescribed.  Pain medication can cause sedation and decrease your breathing if you take more than you need for the level of pain that you have. 7. Nausea is a common side effect of many pain medications.  You may want to eat something before taking your pain medicine to prevent nausea. 8. After an Interscalene nerve block, you cannot feel pain, pressure or extremes in temperature in the effected arm.  Because your arm is numb it is at an increased risk for injury.  To decrease the possibility of injury, please practice the following:  a. While you are awake change the position of  your arm frequently to prevent too much pressure on any one area for prolonged periods of time. b.  If you have a cast or tight dressing, check the color or your fingers every couple of hours.  Call your surgeon with the appearance of any discoloration (white or blue). c. If you are given a sling to wear before you go home, please wear it  at all times until the block has completely worn off.  Do not get up at night without your sling. d. Please contact [facility] or your surgeon if you do not begin to regain sensation after 7 days from the surgery.  Anesthesia may be contacted by calling the Same Day Surgery Department, Mon. through Fri., 6 am to 4 pm at [phone].   e. If you experience any other problems or concerns, please contact your surgeon's office. If you experience severe or prolonged shortness of breath go to the nearest emergency department.       AMBULATORY SURGERY  DISCHARGE INSTRUCTIONS   1) The drugs that you were given will stay in your system until tomorrow so for the next 24 hours you should not:  A) Drive an automobile B) Make any legal decisions C) Drink any alcoholic beverage   2) You may resume regular meals tomorrow.  Today it is better to start with liquids and gradually work up to solid foods.  You may eat anything you prefer, but it is better to start with  liquids, then soup and crackers, and gradually work up to solid foods.   3) Please notify your doctor immediately if you have any unusual bleeding, trouble breathing, redness and pain at the surgery site, drainage, fever, or pain not relieved by medication. 4)   5) Your post-operative visit with Dr.                                     is: Date:                        Time:    Please call to schedule your post-operative visit.  6) Additional Instructions:     Orthopedic discharge instructions: Keep dressing dry and intact.  May shower after dressing changed on post-op day #4 (Saturday).    Cover staples with Band-Aids after drying off. Apply ice frequently to shoulder. Take ibuprofen 600-800 mg TID with meals for 7-10 days, then as necessary. Take oxycodone as prescribed when needed.  May supplement with ES Tylenol if necessary. Keep shoulder immobilizer on at all times except may remove for bathing purposes. Follow-up in 10-14 days or as scheduled.

## 2018-04-30 NOTE — Anesthesia Preprocedure Evaluation (Addendum)
Anesthesia Evaluation  Patient identified by MRN, date of birth, ID band Patient awake    Reviewed: Allergy & Precautions, H&P , NPO status , Patient's Chart, lab work & pertinent test results  Airway Mallampati: III       Dental  (+) Upper Dentures, Partial Lower   Pulmonary former smoker,           Cardiovascular negative cardio ROS       Neuro/Psych negative neurological ROS  negative psych ROS   GI/Hepatic negative GI ROS, Neg liver ROS,   Endo/Other  negative endocrine ROS  Renal/GU      Musculoskeletal   Abdominal   Peds  Hematology negative hematology ROS (+)   Anesthesia Other Findings Obese  Past Medical History: No date: Arthritis 09/03/2017: UTI (urinary tract infection) No date: Vertigo     Comment:  no episodes in over 3 years No date: Wears dentures     Comment:  full upper, partial lower  Past Surgical History: No date: COLONOSCOPY 08/24/2015: SHOULDER ARTHROSCOPY WITH OPEN ROTATOR CUFF REPAIR; Right     Comment:  Procedure: SHOULDER ARTHROSCOPY WITH OPEN ROTATOR CUFF               REPAIR;  Surgeon: Christena Flake, MD;  Location: [facility];               Service: Orthopedics;  Laterality: Right; 09/11/2017: SHOULDER ARTHROSCOPY WITH OPEN ROTATOR CUFF REPAIR; Left     Comment:  Procedure: SHOULDER ARTHROSCOPY WITH OPEN ROTATOR CUFF               REPAIR;  Surgeon: Christena Flake, MD;  Location: [facility];              Service: Orthopedics;  Laterality: Left; 12/26/2017: SHOULDER CLOSED REDUCTION; Left     Comment:  Procedure: CLOSED MANIPULATION SHOULDER WITH STEROID               INJECTION;  Surgeon: Christena Flake, MD;  Location: [facility];  Service: Orthopedics;  Laterality: Left; No date: TUBAL LIGATION  BMI    Body Mass Index:  37.22 kg/m      Reproductive/Obstetrics negative OB ROS                            Anesthesia  Physical Anesthesia Plan  ASA: III  Anesthesia Plan: General ETT and Regional   Post-op Pain Management:    Induction:   PONV Risk Score and Plan: Ondansetron, Dexamethasone, Midazolam and Treatment may vary due to age or medical condition  Airway Management Planned:   Additional Equipment:   Intra-op Plan:   Post-operative Plan:   Informed Consent: I have reviewed the patients History and Physical, chart, labs and discussed the procedure including the risks, benefits and alternatives for the proposed anesthesia with the patient or authorized representative who has indicated his/her understanding and acceptance.     Dental Advisory Given  Plan Discussed with: Anesthesiologist and CRNA  Anesthesia Plan Comments: (H/o burning/tingling in left hand after previous interscalene block that lasted for about one week post op.  Discussed extensively risks and benefits of interscalene block including numbness, tingling, burning, weakness that could be temporary or permanent and she wishes to proceed with the block for post op pain control.)       Anesthesia Quick Evaluation

## 2018-04-30 NOTE — Anesthesia Postprocedure Evaluation (Signed)
Anesthesia Post Note  Patient: Meghan Coleman  Procedure(s) Performed: SHOULDER ARTHROSCOPY WITH DEBRIDEMENT, DECOMPRESSION AND REPAIR OF RECURRENT ROTATOR CUFF TEAR-LEFT (Left )  Patient location during evaluation: PACU Anesthesia Type: Regional Level of consciousness: awake and alert Pain management: pain level controlled Vital Signs Assessment: post-procedure vital signs reviewed and stable Respiratory status: spontaneous breathing, nonlabored ventilation, respiratory function stable and patient connected to nasal cannula oxygen Cardiovascular status: blood pressure returned to baseline and stable Postop Assessment: no apparent nausea or vomiting Anesthetic complications: no     Last Vitals:  Vitals:   04/30/18 1755 04/30/18 1821  BP: 113/76 117/69  Pulse: (!) 105 94  Resp: 14 16  Temp: 36.7 C   SpO2: 95% 95%    Last Pain:  Vitals:   04/30/18 1821  TempSrc:   PainSc: 3                  Lenard Simmer

## 2018-04-30 NOTE — Op Note (Signed)
04/30/2018  4:34 PM  Patient:   Meghan Coleman  Pre-Op Diagnosis:   Nontraumatic recurrent rotator cuff tear, left shoulder.  Post-Op Diagnosis:   Nontraumatic partial-thickness recurrent rotator cuff tear, left shoulder.  Procedure:   Limited arthroscopic debridement, arthroscopic subacromial decompression, and mini-open rotator cuff repair, left shoulder.  Anesthesia:   General endotracheal with interscalene block using Exparel placed preoperatively by the anesthesiologist.  Surgeon:   Meghan Amos, MD  Assistant:   Horris Latino, PA-C  Findings:   As above.  There was a partial-thickness recurrent tear involving the anterior and mid-insertional fibers of the supraspinatus tendon.  The remaining portions of the rotator cuff all were in excellent condition.  There was grade 1-2, focal chondromalacia involving the humeral head but otherwise the articular surfaces were in excellent condition.  The labrum also was in satisfactory condition.  Complications:   None  Fluids:   600 cc  Estimated blood loss:   10 cc  Tourniquet time:   None  Drains:   None  Closure:   Staples      Brief clinical note:   The patient is a 58 year old female who is now 6 months status post a left shoulder arthroscopy with debridement, decompression, rotator cuff repair, and biceps tenodesis. Despite extensive physical therapy, the patient has failed to regain her strength and continues to have difficulty raising her arm even to shoulder level. The patient's history and examination were consistent with a possible recurrent rotator cuff tear. These findings were confirmed by MRI scan. The patient presents at this time for definitive management of these shoulder symptoms.  Procedure:   The patient underwent placement of an interscalene block using Exparel by the anesthesiologist in the preoperative holding area before being brought into the operating room and lain in the supine position. The patient then  underwent general endotracheal intubation and anesthesia before being repositioned in the beach chair position using the beach chair positioner. The left shoulder and upper extremity were prepped with ChloraPrep solution before being draped sterilely. Preoperative antibiotics were administered. A timeout was performed to confirm the proper surgical site before the expected portal sites and incision site were injected with 0.5% Sensorcaine with epinephrine. A posterior portal was created and the glenohumeral joint thoroughly inspected with the findings as described above. An anterior portal was created using an outside-in technique. The labrum and rotator cuff were further probed, again confirming the above-noted findings. Areas of synovitis anteriorly and superiorly were debrided back to stable margins using the full-radius resector. The ArthroCare wand was inserted and used to obtain hemostasis as well as to "anneal" the labrum superiorly and anteriorly. The instruments were removed from the joint after suctioning the excess fluid.  The camera was repositioned through the posterior portal into the subacromial space. A separate lateral portal was created using an outside-in technique. The 3.5 mm full-radius resector was introduced and used to perform a subtotal bursectomy. The ArthroCare wand was then inserted and used to remove scar tissue off the undersurface of the anterior third of the acromion as well as to recess the coracoacromial ligament from its attachment along the anterior and lateral margins of the acromion. The patient already had undergone a bony acromioplasty previously, so no additional bone required removal. The instruments were then removed from the subacromial space after suctioning the excess fluid.  Utilizing the previous incision, an approximately 4-5 cm incision was made over the anterolateral aspect of the shoulder beginning at the anterolateral corner of the  acromion and extending  distally in line with the bicipital groove. This incision was carried down through the subcutaneous tissues to expose the deltoid fascia. The raphae between the anterior and middle thirds was identified and this plane developed to provide access into the subacromial space. Additional bursal tissues were debrided sharply using Metzenbaum scissors. The rotator cuff tear was carefully inspected. There appeared to be moderate thinning of the rotator cuff tissue at the repair site over the greater tuberosity footprint. Given that the patient's weakness and MRI findings, It was felt best to release this area of thinned rotator cuff tissue and advanced the rotator cuff more laterally. The margins of the attenuated rotator cuff tissue were debrided sharply with a #15 blade and the exposed greater tuberosity roughened with a rongeur. The tear was repaired using two Biomet 2.9 mm JuggerKnot anchors. These sutures were then brought back laterally and secured using two [place] QuatroLink anchors to create a two-layer closure. An apparent watertight closure was obtained.  The wound was copiously irrigated with sterile saline solution before the deltoid raphae was reapproximated using 2-0 Vicryl interrupted sutures. The subcutaneous tissues were closed in two layers using 2-0 Vicryl interrupted sutures before the skin was closed using staples. The portal sites also were closed using staples. A sterile bulky dressing was applied to the shoulder before the arm was placed into a shoulder immobilizer. The patient was then awakened, extubated, and returned to the recovery room in satisfactory condition after tolerating the procedure well.

## 2018-05-01 ENCOUNTER — Encounter: Payer: Self-pay | Admitting: Surgery

## 2018-06-03 ENCOUNTER — Telehealth: Payer: Self-pay | Admitting: Urology

## 2018-06-03 NOTE — Telephone Encounter (Signed)
Pt wants to know if she can have something else called in besides Myrbetriq, she can't afford it.

## 2018-06-03 NOTE — Telephone Encounter (Signed)
See my chart message

## 2018-06-18 ENCOUNTER — Other Ambulatory Visit: Payer: Self-pay | Admitting: Physician Assistant

## 2018-06-18 DIAGNOSIS — Z1231 Encounter for screening mammogram for malignant neoplasm of breast: Secondary | ICD-10-CM

## 2018-09-10 ENCOUNTER — Other Ambulatory Visit: Payer: Self-pay | Admitting: Family Medicine

## 2018-09-10 MED ORDER — OXYBUTYNIN CHLORIDE ER 10 MG PO TB24: 10.0000 mg | ORAL_TABLET | Freq: Every day | ORAL | 6 refills | Status: DC

## 2018-09-19 ENCOUNTER — Ambulatory Visit
Admission: RE | Admit: 2018-09-19 | Discharge: 2018-09-19 | Disposition: A | Discharge status: Home / Self Care | Payer: BC Managed Care – PPO | Source: Ambulatory Visit | Attending: Physician Assistant | Admitting: Physician Assistant

## 2018-09-19 DIAGNOSIS — Z1231 Encounter for screening mammogram for malignant neoplasm of breast: Secondary | ICD-10-CM | POA: Insufficient documentation

## 2018-09-25 ENCOUNTER — Other Ambulatory Visit: Payer: Self-pay | Admitting: Physician Assistant

## 2018-09-25 DIAGNOSIS — R928 Other abnormal and inconclusive findings on diagnostic imaging of breast: Secondary | ICD-10-CM

## 2018-09-25 DIAGNOSIS — R921 Mammographic calcification found on diagnostic imaging of breast: Secondary | ICD-10-CM

## 2018-10-03 ENCOUNTER — Ambulatory Visit
Admission: RE | Admit: 2018-10-03 | Discharge: 2018-10-03 | Disposition: A | Discharge status: Home / Self Care | Payer: BC Managed Care – PPO | Source: Ambulatory Visit | Attending: Physician Assistant | Admitting: Physician Assistant

## 2018-10-03 DIAGNOSIS — R928 Other abnormal and inconclusive findings on diagnostic imaging of breast: Secondary | ICD-10-CM

## 2018-10-03 DIAGNOSIS — R921 Mammographic calcification found on diagnostic imaging of breast: Secondary | ICD-10-CM | POA: Diagnosis present

## 2019-03-03 ENCOUNTER — Other Ambulatory Visit: Payer: Self-pay

## 2019-03-03 ENCOUNTER — Encounter: Payer: Self-pay | Admitting: Urology

## 2019-03-03 ENCOUNTER — Ambulatory Visit: Payer: BC Managed Care – PPO | Admitting: Urology

## 2019-03-03 VITALS — BP 137/85 | HR 91 | Ht 61.0 in | Wt 201.0 lb

## 2019-03-03 DIAGNOSIS — N3946 Mixed incontinence: Secondary | ICD-10-CM

## 2019-03-03 MED ORDER — OXYBUTYNIN CHLORIDE ER 10 MG PO TB24: 10.0000 mg | ORAL_TABLET | Freq: Every day | ORAL | 3 refills | Status: DC

## 2019-03-03 MED ORDER — OXYBUTYNIN CHLORIDE ER 10 MG PO TB24: 10.0000 mg | ORAL_TABLET | Freq: Every day | ORAL | 5 refills | Status: DC

## 2019-03-03 NOTE — Addendum Note (Signed)
Addended by: Milas Kocher A on: 03/03/2019 09:01 AM   Modules accepted: Orders

## 2019-03-03 NOTE — Progress Notes (Signed)
03/03/2019 8:37 AM   Meghan Coleman 04-08-1960 [id number]  Referring provider: [facility] [address],  [place] [address]  Chief Complaint  Patient presents with  . Follow-up    HPI: I was consulted to assess the patient's urinary incontinence worsening over months to years. She leaks with coughing sneezing bending and lifting and also reports urge incontinence and both are significant. She can have mild bedwetting but also significant foot on the floor syndrome. She wears 3 pads per day moderately wet.  She voids every 90 minutes and gets up 3 times per night to urinate.  Mild to moderate grade 2 hypermobility the bladder neck and a negative cough test. Grade 1 cystocele and no rectocele  The patient has mixed incontinence and moderate nighttime symptoms including enuresis. She likely has a significant overactive bladder component. She has moderate frequency and nighttime frequency as well.  On urodynamics she emptied efficiently. Her bladder capacity was 260 mL. Bladder was unstable reaching a pressure of 15 cm water. She leaked a mild amount. She was triggering instability with coughing. Hassan Rowan felt that she did leak at 80 cm of water at100 mL during the second filling. She then triggered another contraction. She did not leak at 86 cm water. During voluntary voiding she voided 148 mL with a maximal flow 20 mils per second. Maximum voiding pressure was 17 cm water. She emptied efficiently.   The patient primarily has an overactive bladder with mild stress incontinence by history and perhaps urodynamics. One would need to re-quantitate the stress component if she fails medical and behavioral therapy for the overactive bladder component.  She chose not to see the physical therapist.   The beta 3 agonist helped for a few weeks but the effect wore off.   To clarify I believe the patient failed Vesicare 10 mg samples but was  doing great and was dry on oxybutynin ER 10 mg twice a day.   I will see in a year- doing well  Today Urinary incontinence excellent.  No blood or infections.  Frequency stable.  Prescription renewed 30x11 and see in 1 year  PMH: Past Medical History:  Diagnosis Date  . Arthritis   . UTI (urinary tract infection) 09/03/2017  . Vertigo    no episodes in over 3 years  . Wears dentures    full upper, partial lower    Surgical History: Past Surgical History:  Procedure Laterality Date  . COLONOSCOPY    . SHOULDER ARTHROSCOPY WITH OPEN ROTATOR CUFF REPAIR Right 08/24/2015   Procedure: SHOULDER ARTHROSCOPY WITH OPEN ROTATOR CUFF REPAIR;  Surgeon: Corky Mull, MD;  Location: [facility];  Service: Orthopedics;  Laterality: Right;  . SHOULDER ARTHROSCOPY WITH OPEN ROTATOR CUFF REPAIR Left 09/11/2017   Procedure: SHOULDER ARTHROSCOPY WITH OPEN ROTATOR CUFF REPAIR;  Surgeon: Corky Mull, MD;  Location: [facility];  Service: Orthopedics;  Laterality: Left;  . SHOULDER ARTHROSCOPY WITH SUBACROMIAL DECOMPRESSION AND BICEP TENDON REPAIR Left 04/30/2018   Procedure: SHOULDER ARTHROSCOPY WITH DEBRIDEMENT, DECOMPRESSION AND REPAIR OF RECURRENT ROTATOR CUFF TEAR-LEFT;  Surgeon: Corky Mull, MD;  Location: [facility];  Service: Orthopedics;  Laterality: Left;  . SHOULDER CLOSED REDUCTION Left 12/26/2017   Procedure: CLOSED MANIPULATION SHOULDER WITH STEROID INJECTION;  Surgeon: Corky Mull, MD;  Location: [place];  Service: Orthopedics;  Laterality: Left;  . TUBAL LIGATION      Home Medications:  Allergies as of 03/03/2019  Reactions   Bactrim [sulfamethoxazole-trimethoprim] Other (See Comments)   Burning to her mouth   Tramadol Hcl Nausea And Vomiting      Medication List       Accurate as of March 03, 2019  8:37 AM. If you have any questions, ask your nurse or doctor.        ibuprofen 200 MG tablet Commonly known as: ADVIL Take 800 mg by mouth 2 (two) times daily.    oxybutynin 10 MG 24 hr tablet Commonly known as: DITROPAN-XL Take 1 tablet (10 mg total) by mouth at bedtime.   oxyCODONE 5 MG immediate release tablet Commonly known as: Roxicodone Take 1-2 tablets (5-10 mg total) by mouth every 4 (four) hours as needed for severe pain.       Allergies:  Allergies  Allergen Reactions  . Bactrim [Sulfamethoxazole-Trimethoprim] Other (See Comments)    Burning to her mouth  . Tramadol Hcl Nausea And Vomiting    Family History: Family History  Problem Relation Age of Onset  . Bladder Cancer Neg Hx   . Kidney cancer Neg Hx   . Breast cancer Neg Hx     Social History:  reports that she quit smoking about 33 years ago. Her smoking use included cigarettes. She has a 0.25 pack-year smoking history. She has never used smokeless tobacco. She reports that she does not drink alcohol or use drugs.  ROS:                                        Physical Exam: Ht 5\' 1"  (1.549 m)   BMI 37.22 kg/m   Constitutional:  Alert and oriented, No acute distress.  Laboratory Data: Lab Results  Component Value Date   WBC 6.1 02/04/2015   HGB 12.1 02/04/2015   HCT 36.2 02/04/2015   MCV 83.6 02/04/2015   PLT 270 02/04/2015    Lab Results  Component Value Date   CREATININE 0.69 02/04/2015    No results found for: PSA  No results found for: TESTOSTERONE  No results found for: HGBA1C  Urinalysis    Component Value Date/Time   COLORURINE YELLOW 02/04/2015 1139   APPEARANCEUR Cloudy (A) 09/03/2017 1131   LABSPEC 1.013 02/04/2015 1139   LABSPEC 1.010 05/26/2014 1545   PHURINE 5.5 02/04/2015 1139   GLUCOSEU Negative 09/03/2017 1131   GLUCOSEU Negative 05/26/2014 1545   HGBUR NEGATIVE 02/04/2015 1139   BILIRUBINUR Negative 09/03/2017 1131   BILIRUBINUR Negative 05/26/2014 1545   KETONESUR 15 (A) 02/04/2015 1139   PROTEINUR Negative 09/03/2017 1131   PROTEINUR NEGATIVE 02/04/2015 1139   NITRITE Positive (A) 09/03/2017 1131    NITRITE NEGATIVE 02/04/2015 1139   LEUKOCYTESUR 2+ (A) 09/03/2017 1131   LEUKOCYTESUR 1+ 05/26/2014 1545    Pertinent Imaging:   Assessment & Plan: See in 1 year  There are no diagnoses linked to this encounter.  No follow-ups on file.  05/28/2014, MD  [facility] [address] [place], [place] [phone]

## 2019-04-01 ENCOUNTER — Other Ambulatory Visit: Payer: Self-pay

## 2019-04-01 MED ORDER — OXYBUTYNIN CHLORIDE ER 10 MG PO TB24: 10.0000 mg | ORAL_TABLET | Freq: Two times a day (BID) | ORAL | 11 refills | Status: DC

## 2019-07-22 ENCOUNTER — Other Ambulatory Visit: Payer: Self-pay | Admitting: Family Medicine

## 2019-07-22 DIAGNOSIS — M5442 Lumbago with sciatica, left side: Secondary | ICD-10-CM

## 2019-08-27 ENCOUNTER — Other Ambulatory Visit: Payer: Self-pay | Admitting: Family Medicine

## 2019-08-30 ENCOUNTER — Ambulatory Visit
Admission: RE | Admit: 2019-08-30 | Discharge: 2019-08-30 | Disposition: A | Discharge status: Home / Self Care | Payer: BC Managed Care – PPO | Source: Ambulatory Visit | Attending: Family Medicine | Admitting: Family Medicine

## 2019-08-30 DIAGNOSIS — M5442 Lumbago with sciatica, left side: Secondary | ICD-10-CM

## 2019-09-03 ENCOUNTER — Other Ambulatory Visit: Payer: Self-pay | Admitting: Physician Assistant

## 2020-03-04 ENCOUNTER — Ambulatory Visit: Payer: BC Managed Care – PPO | Admitting: Physician Assistant

## 2020-03-04 ENCOUNTER — Other Ambulatory Visit: Payer: Self-pay

## 2020-03-08 ENCOUNTER — Ambulatory Visit: Payer: BC Managed Care – PPO | Admitting: Urology

## 2020-04-26 ENCOUNTER — Ambulatory Visit: Payer: BC Managed Care – PPO | Admitting: Urology

## 2020-04-27 ENCOUNTER — Encounter: Payer: Self-pay | Admitting: Physician Assistant

## 2020-04-27 ENCOUNTER — Other Ambulatory Visit: Payer: Self-pay

## 2020-04-27 ENCOUNTER — Ambulatory Visit (INDEPENDENT_AMBULATORY_CARE_PROVIDER_SITE_OTHER): Payer: Medicare HMO | Admitting: Physician Assistant

## 2020-04-27 VITALS — BP 142/85 | HR 86 | Ht 61.0 in | Wt 180.0 lb

## 2020-04-27 DIAGNOSIS — R300 Dysuria: Secondary | ICD-10-CM | POA: Diagnosis not present

## 2020-04-27 LAB — URINALYSIS, COMPLETE
Bilirubin, UA: NEGATIVE
Glucose, UA: NEGATIVE
Ketones, UA: NEGATIVE
Nitrite, UA: POSITIVE — AB
Protein,UA: NEGATIVE
Specific Gravity, UA: 1.015 (ref 1.005–1.030)
Urobilinogen, Ur: 0.2 mg/dL (ref 0.2–1.0)
pH, UA: 7 (ref 5.0–7.5)

## 2020-04-27 LAB — MICROSCOPIC EXAMINATION
Cast Type: NONE SEEN
Casts: NONE SEEN /lpf
Crystal Type: NONE SEEN
Crystals: NONE SEEN
Mucus, UA: NONE SEEN
Trichomonas, UA: NONE SEEN
WBC, UA: >30 /hpf — AB (ref 0–5)
Yeast, UA: NONE SEEN

## 2020-04-27 MED ORDER — NITROFURANTOIN MONOHYD MACRO 100 MG PO CAPS: 100.0000 mg | ORAL_CAPSULE | Freq: Two times a day (BID) | ORAL | 0 refills | Status: AC

## 2020-04-27 NOTE — Progress Notes (Signed)
04/27/2020 5:33 PM   Meghan Coleman 06/22/1960 [id number]  CC: Chief Complaint  Patient presents with  . Urinary Tract Infection    HPI: Meghan Coleman is a 60 y.o. female with PMH OAB with mixed incontinence on Ditropan XL who presents today for evaluation of possible UTI.   Today she reports a 3 to 4-week history of dysuria, urgency, frequency, lower abdominal pain, and low back pain.  She took cranberry supplements at home with mild relief of her symptoms.  In-office UA today positive for trace lysed blood, nitrites, and 2+ leukocyte esterase; urine microscopy with >30 WBCs/HPF and many bacteria.  PMH: Past Medical History:  Diagnosis Date  . Arthritis   . UTI (urinary tract infection) 09/03/2017  . Vertigo    no episodes in over 3 years  . Wears dentures    full upper, partial lower    Surgical History: Past Surgical History:  Procedure Laterality Date  . COLONOSCOPY    . SHOULDER ARTHROSCOPY WITH OPEN ROTATOR CUFF REPAIR Right 08/24/2015   Procedure: SHOULDER ARTHROSCOPY WITH OPEN ROTATOR CUFF REPAIR;  Surgeon: Christena Flake, MD;  Location: [facility];  Service: Orthopedics;  Laterality: Right;  . SHOULDER ARTHROSCOPY WITH OPEN ROTATOR CUFF REPAIR Left 09/11/2017   Procedure: SHOULDER ARTHROSCOPY WITH OPEN ROTATOR CUFF REPAIR;  Surgeon: Christena Flake, MD;  Location: [facility];  Service: Orthopedics;  Laterality: Left;  . SHOULDER ARTHROSCOPY WITH SUBACROMIAL DECOMPRESSION AND BICEP TENDON REPAIR Left 04/30/2018   Procedure: SHOULDER ARTHROSCOPY WITH DEBRIDEMENT, DECOMPRESSION AND REPAIR OF RECURRENT ROTATOR CUFF TEAR-LEFT;  Surgeon: Christena Flake, MD;  Location: [facility];  Service: Orthopedics;  Laterality: Left;  . SHOULDER CLOSED REDUCTION Left 12/26/2017   Procedure: CLOSED MANIPULATION SHOULDER WITH STEROID INJECTION;  Surgeon: Christena Flake, MD;  Location: [facility];  Service: Orthopedics;  Laterality: Left;  . TUBAL LIGATION      Home Medications:   Allergies as of 04/27/2020      Reactions   Bactrim [sulfamethoxazole-trimethoprim] Other (See Comments)   Burning to her mouth   Tramadol Hcl Nausea And Vomiting      Medication List       Accurate as of April 27, 2020  5:33 PM. If you have any questions, ask your nurse or doctor.        STOP taking these medications   ibuprofen 200 MG tablet Commonly known as: ADVIL Stopped by: Carman Ching, PA-C     TAKE these medications   nitrofurantoin (macrocrystal-monohydrate) 100 MG capsule Commonly known as: MACROBID Take 1 capsule (100 mg total) by mouth every 12 (twelve) hours for 5 days. Started by: Carman Ching, PA-C   oxybutynin 10 MG 24 hr tablet Commonly known as: DITROPAN-XL Take 1 tablet (10 mg total) by mouth 2 (two) times daily.       Allergies:  Allergies  Allergen Reactions  . Bactrim [Sulfamethoxazole-Trimethoprim] Other (See Comments)    Burning to her mouth  . Tramadol Hcl Nausea And Vomiting    Family History: Family History  Problem Relation Age of Onset  . Bladder Cancer Neg Hx   . Kidney cancer Neg Hx   . Breast cancer Neg Hx     Social History:   reports that she quit smoking about 34 years ago. Her smoking use included cigarettes. She has a 0.25 pack-year smoking history. She has never used smokeless tobacco. She reports that she does not drink alcohol and does not use drugs.  Physical Exam: BP Marland Kitchen)  142/85 (BP Location: Left Arm, Patient Position: Sitting, Cuff Size: Large)   Pulse 86   Ht 5\' 1"  (1.549 m)   Wt 180 lb (81.6 kg)   BMI 34.01 kg/m   Constitutional:  Alert and oriented, no acute distress, nontoxic appearing HEENT: [place], AT Cardiovascular: No clubbing, cyanosis, or edema Respiratory: Normal respiratory effort, no increased work of breathing Skin: No rashes, bruises or suspicious lesions Neurologic: Grossly intact, no focal deficits, moving all 4 extremities Psychiatric: Normal mood and affect  Laboratory  Data: Results for orders placed or performed in visit on 04/27/20  Microscopic Examination   Urine  Result Value Ref Range   WBC, UA >30 (A) 0 - 5 /hpf   RBC 0-2 0 - 2 /hpf   Epithelial Cells (non renal) 0-10 0 - 10 /hpf   Renal Epithel, UA 0-10 (A) None seen /hpf   Casts None seen None seen /lpf   Cast Type None seen N/A   Crystals None seen N/A   Crystal Type None seen N/A   Mucus, UA None seen Not Estab.   Bacteria, UA Many (A) None seen/Few   Yeast, UA None seen None seen   Trichomonas, UA None seen None seen  Urinalysis, Complete  Result Value Ref Range   Specific Gravity, UA 1.015 1.005 - 1.030   pH, UA 7.0 5.0 - 7.5   Color, UA Yellow Yellow   Appearance Ur Cloudy (A) Clear   Leukocytes,UA 2+ (A) Negative   Protein,UA Negative Negative/Trace   Glucose, UA Negative Negative   Ketones, UA Negative Negative   RBC, UA Trace (A) Negative   Bilirubin, UA Negative Negative   Urobilinogen, Ur 0.2 0.2 - 1.0 mg/dL   Nitrite, UA Positive (A) Negative   Microscopic Examination See below:    Assessment & Plan:   1. Dysuria UA grossly infected today.  Will send for culture for further evaluation and start empiric Macrobid.  Patient is in agreement with this plan. - Urinalysis, Complete - CULTURE, URINE COMPREHENSIVE - nitrofurantoin, macrocrystal-monohydrate, (MACROBID) 100 MG capsule; Take 1 capsule (100 mg total) by mouth every 12 (twelve) hours for 5 days.  Dispense: 10 capsule; Refill: 0  Return if symptoms worsen or fail to improve.  06/27/20, PA-C  [facility] [address] [place], [place] [phone]

## 2020-04-30 LAB — CULTURE, URINE COMPREHENSIVE

## 2020-05-24 ENCOUNTER — Encounter: Payer: Self-pay | Admitting: Urology

## 2020-05-24 ENCOUNTER — Other Ambulatory Visit: Payer: Self-pay

## 2020-05-24 ENCOUNTER — Ambulatory Visit: Payer: Medicare HMO | Admitting: Urology

## 2020-05-24 VITALS — BP 138/83 | HR 101

## 2020-05-24 DIAGNOSIS — N3946 Mixed incontinence: Secondary | ICD-10-CM | POA: Diagnosis not present

## 2020-05-24 MED ORDER — OXYBUTYNIN CHLORIDE ER 15 MG PO TB24: 15.0000 mg | ORAL_TABLET | Freq: Every day | ORAL | 11 refills | Status: DC

## 2020-05-24 NOTE — Progress Notes (Signed)
05/24/2020 1:50 PM   Meghan Coleman 11/06/1960 [id number]  Referring provider: [facility] [address] [place],  [place] [address]  Chief Complaint  Patient presents with  . Urinary Incontinence    HPI: I was consulted to assess the patient's urinary incontinence worsening over months to years. She leaks with coughing sneezing bending and lifting and also reports urge incontinence and both are significant. She can have mild bedwetting but also significant foot on the floor syndrome. She wears 3 pads per day moderately wet.  She voids every 90 minutes and gets up 3 times per night to urinate.  Mild to moderate grade 2 hypermobility the bladder neck and a negative cough test. Grade 1 cystocele and no rectocele  The patient has mixed incontinence and moderate nighttime symptoms including enuresis. She likely has a significant overactive bladder component. She has moderate frequency and nighttime frequency as well.  On urodynamics she emptied efficiently. Her bladder capacity was 260 mL. Bladder was unstable reaching a pressure of 15 cm water. She leaked a mild amount. She was triggering instability with coughing. Steward Drone felt that she did leak at 80 cm of water at100 mL during the second filling. She then triggered another contraction. She did not leak at 86 cm water. During voluntary voiding she voided 148 mL with a maximal flow 20 mils per second. Maximum voiding pressure was 17 cm water. She emptied efficiently.   The patient primarily has an overactive bladder with mild stress incontinence by history and perhaps urodynamics. One would need to re-quantitate the stress component if she fails medical and behavioral therapy for the overactive bladder component.  She chose not to see the physical therapist.   The beta 3 agonist helped for a few weeks but the effect wore off.   To clarify I believe the patient failed Vesicare 10 mg samples  but was doing great and was dry on oxybutynin ER 10 mg twice a day.   I will see in a year- doing well  Urinary continence excellent.  No blood or infections.  Frequency stable.  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  Was on oxybutynin was given Macrobid.  Culture was positive Clinically not infected they have a send urine for culture  Even before the infection she started to have a little bit more urgency incontinence wearing 3 pads a day that I think are generally damp.  She was almost completely dry before.  We decided to increase to oxybutynin ER 15 mg and reevaluate treatment options in about 6 weeks  She said that something near her bladder was seen on an x-ray.  She was noted to have multiple small benign renal cysts stable since 2015 on a back MRI    PMH: Past Medical History:  Diagnosis Date  . Arthritis   . UTI (urinary tract infection) 09/03/2017  . Vertigo    no episodes in over 3 years  . Wears dentures    full upper, partial lower    Surgical History: Past Surgical History:  Procedure Laterality Date  . COLONOSCOPY    . SHOULDER ARTHROSCOPY WITH OPEN ROTATOR CUFF REPAIR Right 08/24/2015   Procedure: SHOULDER ARTHROSCOPY WITH OPEN ROTATOR CUFF REPAIR;  Surgeon: Christena Flake, MD;  Location: [facility];  Service: Orthopedics;  Laterality: Right;  . SHOULDER ARTHROSCOPY WITH OPEN ROTATOR CUFF REPAIR Left 09/11/2017   Procedure: SHOULDER ARTHROSCOPY WITH  OPEN ROTATOR CUFF REPAIR;  Surgeon: Christena Flake, MD;  Location: [facility];  Service: Orthopedics;  Laterality: Left;  . SHOULDER ARTHROSCOPY WITH SUBACROMIAL DECOMPRESSION AND BICEP TENDON REPAIR Left 04/30/2018   Procedure: SHOULDER ARTHROSCOPY WITH DEBRIDEMENT, DECOMPRESSION AND REPAIR OF RECURRENT ROTATOR CUFF TEAR-LEFT;  Surgeon: Christena Flake, MD;  Location: [facility];  Service: Orthopedics;  Laterality: Left;  . SHOULDER CLOSED REDUCTION Left  12/26/2017   Procedure: CLOSED MANIPULATION SHOULDER WITH STEROID INJECTION;  Surgeon: Christena Flake, MD;  Location: [facility];  Service: Orthopedics;  Laterality: Left;  . TUBAL LIGATION      Home Medications:  Allergies as of 05/24/2020      Reactions   Bactrim [sulfamethoxazole-trimethoprim] Other (See Comments)   Burning to her mouth   Tramadol Hcl Nausea And Vomiting      Medication List       Accurate as of May 24, 2020  1:50 PM. If you have any questions, ask your nurse or doctor.        oxybutynin 10 MG 24 hr tablet Commonly known as: DITROPAN-XL Take 1 tablet (10 mg total) by mouth 2 (two) times daily.       Allergies:  Allergies  Allergen Reactions  . Bactrim [Sulfamethoxazole-Trimethoprim] Other (See Comments)    Burning to her mouth  . Tramadol Hcl Nausea And Vomiting    Family History: Family History  Problem Relation Age of Onset  . Bladder Cancer Neg Hx   . Kidney cancer Neg Hx   . Breast cancer Neg Hx     Social History:  reports that she quit smoking about 34 years ago. Her smoking use included cigarettes. She has a 0.25 pack-year smoking history. She has never used smokeless tobacco. She reports that she does not drink alcohol and does not use drugs.  ROS:                                        Physical Exam: BP 138/83   Pulse (!) 101   Constitutional:  Alert and oriented, No acute distress. HEENT: [place] AT, moist mucus membranes.  Trachea midline, no masses.  Laboratory Data: Lab Results  Component Value Date   WBC 6.1 02/04/2015   HGB 12.1 02/04/2015   HCT 36.2 02/04/2015   MCV 83.6 02/04/2015   PLT 270 02/04/2015    Lab Results  Component Value Date   CREATININE 0.69 02/04/2015    No results found for: PSA  No results found for: TESTOSTERONE  No results found for: HGBA1C  Urinalysis    Component Value Date/Time   COLORURINE YELLOW 02/04/2015 1139   APPEARANCEUR Cloudy (A) 04/27/2020  0911   LABSPEC 1.013 02/04/2015 1139   LABSPEC 1.010 05/26/2014 1545   PHURINE 5.5 02/04/2015 1139   GLUCOSEU Negative 04/27/2020 0911   GLUCOSEU Negative 05/26/2014 1545   HGBUR NEGATIVE 02/04/2015 1139   BILIRUBINUR Negative 04/27/2020 0911   BILIRUBINUR Negative 05/26/2014 1545   KETONESUR 15 (A) 02/04/2015 1139   PROTEINUR Negative 04/27/2020 0911   PROTEINUR NEGATIVE 02/04/2015 1139   NITRITE Positive (A) 04/27/2020 0911   NITRITE NEGATIVE 02/04/2015 1139   LEUKOCYTESUR 2+ (A) 04/27/2020 0911   LEUKOCYTESUR 1+ 05/26/2014 1545    Pertinent Imaging:   Assessment & Plan: Prescription changed and reassess in 6 weeks  There are no diagnoses linked to this encounter.  No follow-ups on file.  Reece Packer, MD  [facility] [address], [place], [place] [address] [phone]

## 2020-05-24 NOTE — Addendum Note (Signed)
Addended by: Veneta Penton on: 05/24/2020 02:14 PM   Modules accepted: Orders

## 2020-05-25 LAB — URINALYSIS, COMPLETE
Bilirubin, UA: NEGATIVE
Glucose, UA: NEGATIVE
Ketones, UA: NEGATIVE
Leukocytes,UA: NEGATIVE
Nitrite, UA: NEGATIVE
Protein,UA: NEGATIVE
RBC, UA: NEGATIVE
Specific Gravity, UA: 1.015 (ref 1.005–1.030)
Urobilinogen, Ur: 0.2 mg/dL (ref 0.2–1.0)
pH, UA: 5.5 (ref 5.0–7.5)

## 2020-05-25 LAB — MICROSCOPIC EXAMINATION

## 2020-05-27 LAB — CULTURE, URINE COMPREHENSIVE

## 2020-06-25 ENCOUNTER — Other Ambulatory Visit: Payer: Self-pay

## 2020-06-25 ENCOUNTER — Emergency Department: Payer: Medicare HMO

## 2020-06-25 ENCOUNTER — Emergency Department
Admission: EM | Admit: 2020-06-25 | Discharge: 2020-06-25 | Disposition: A | Discharge status: Home / Self Care | Payer: Medicare HMO | Attending: Emergency Medicine | Admitting: Emergency Medicine

## 2020-06-25 DIAGNOSIS — Z87891 Personal history of nicotine dependence: Secondary | ICD-10-CM | POA: Diagnosis not present

## 2020-06-25 DIAGNOSIS — R1012 Left upper quadrant pain: Secondary | ICD-10-CM | POA: Insufficient documentation

## 2020-06-25 DIAGNOSIS — R110 Nausea: Secondary | ICD-10-CM | POA: Diagnosis not present

## 2020-06-25 LAB — URINALYSIS, COMPLETE (UACMP) WITH MICROSCOPIC
Bacteria, UA: NONE SEEN
Bilirubin Urine: NEGATIVE
Glucose, UA: NEGATIVE mg/dL
Hgb urine dipstick: NEGATIVE
Ketones, ur: NEGATIVE mg/dL
Nitrite: NEGATIVE
Protein, ur: NEGATIVE mg/dL
Specific Gravity, Urine: 1.011 (ref 1.005–1.030)
pH: 7 (ref 5.0–8.0)

## 2020-06-25 LAB — CBC WITH DIFFERENTIAL/PLATELET
Abs Immature Granulocytes: 0.01 10*3/uL (ref 0.00–0.07)
Basophils Absolute: 0 10*3/uL (ref 0.0–0.1)
Basophils Relative: 1 %
Eosinophils Absolute: 0.1 10*3/uL (ref 0.0–0.5)
Eosinophils Relative: 2 %
HCT: 36 % (ref 36.0–46.0)
Hemoglobin: 12.6 g/dL (ref 12.0–15.0)
Immature Granulocytes: 0 %
Lymphocytes Relative: 52 %
Lymphs Abs: 2.8 10*3/uL (ref 0.7–4.0)
MCH: 29.1 pg (ref 26.0–34.0)
MCHC: 35 g/dL (ref 30.0–36.0)
MCV: 83.1 fL (ref 80.0–100.0)
Monocytes Absolute: 0.8 10*3/uL (ref 0.1–1.0)
Monocytes Relative: 14 %
Neutro Abs: 1.6 10*3/uL — ABNORMAL LOW (ref 1.7–7.7)
Neutrophils Relative %: 31 %
Platelets: 287 10*3/uL (ref 150–400)
RBC: 4.33 MIL/uL (ref 3.87–5.11)
RDW: 12.1 % (ref 11.5–15.5)
WBC: 5.3 10*3/uL (ref 4.0–10.5)
nRBC: 0 % (ref 0.0–0.2)

## 2020-06-25 LAB — COMPREHENSIVE METABOLIC PANEL
ALT: 16 U/L (ref 0–44)
AST: 23 U/L (ref 15–41)
Albumin: 4.2 g/dL (ref 3.5–5.0)
Alkaline Phosphatase: 76 U/L (ref 38–126)
Anion gap: 8 (ref 5–15)
BUN: 9 mg/dL (ref 6–20)
CO2: 28 mmol/L (ref 22–32)
Calcium: 9.9 mg/dL (ref 8.9–10.3)
Chloride: 104 mmol/L (ref 98–111)
Creatinine, Ser: 0.67 mg/dL (ref 0.44–1.00)
GFR, Estimated: >60 mL/min (ref >60)
Glucose, Bld: 97 mg/dL (ref 70–99)
Potassium: 3.3 mmol/L — ABNORMAL LOW (ref 3.5–5.1)
Sodium: 140 mmol/L (ref 135–145)
Total Bilirubin: 0.9 mg/dL (ref 0.3–1.2)
Total Protein: 7.9 g/dL (ref 6.5–8.1)

## 2020-06-25 LAB — TROPONIN I (HIGH SENSITIVITY): Troponin I (High Sensitivity): 2 ng/L (ref <18)

## 2020-06-25 LAB — LIPASE, BLOOD: Lipase: 31 U/L (ref 11–51)

## 2020-06-25 MED ORDER — ONDANSETRON 4 MG PO TBDP: 4.0000 mg | ORAL_TABLET | Freq: Three times a day (TID) | ORAL | 0 refills | Status: AC | PRN

## 2020-06-25 MED ORDER — ONDANSETRON HCL 4 MG/2ML IJ SOLN
4.0000 mg | Freq: Once | INTRAMUSCULAR | Status: AC
  Administered 2020-06-25: 4 mg via INTRAVENOUS
  Filled 2020-06-25: qty 2

## 2020-06-25 MED ORDER — IOHEXOL 300 MG/ML  SOLN
100.0000 mL | Freq: Once | INTRAMUSCULAR | Status: AC | PRN
  Administered 2020-06-25: 100 mL via INTRAVENOUS

## 2020-06-25 MED ORDER — KETOROLAC TROMETHAMINE 30 MG/ML IJ SOLN
15.0000 mg | Freq: Once | INTRAMUSCULAR | Status: AC
  Administered 2020-06-25: 15 mg via INTRAVENOUS
  Filled 2020-06-25: qty 1

## 2020-06-25 MED ORDER — DICYCLOMINE HCL 10 MG PO CAPS
10.0000 mg | ORAL_CAPSULE | Freq: Once | ORAL | Status: AC
  Administered 2020-06-25: 10 mg via ORAL
  Filled 2020-06-25: qty 1

## 2020-06-25 MED ORDER — DICYCLOMINE HCL 10 MG PO CAPS: 10.0000 mg | ORAL_CAPSULE | Freq: Four times a day (QID) | ORAL | 0 refills | Status: DC

## 2020-06-25 MED ORDER — FENTANYL CITRATE (PF) 100 MCG/2ML IJ SOLN
50.0000 ug | Freq: Once | INTRAMUSCULAR | Status: AC
  Administered 2020-06-25: 50 ug via INTRAVENOUS
  Filled 2020-06-25: qty 2

## 2020-06-25 MED ORDER — FAMOTIDINE IN NACL 20-0.9 MG/50ML-% IV SOLN
20.0000 mg | Freq: Once | INTRAVENOUS | Status: AC
  Administered 2020-06-25: 20 mg via INTRAVENOUS
  Filled 2020-06-25: qty 50

## 2020-06-25 NOTE — Discharge Instructions (Signed)

## 2020-06-25 NOTE — ED Notes (Signed)
Report given to Renee, RN, care transferred 

## 2020-06-25 NOTE — ED Triage Notes (Signed)
Pt in with co upper abd pain that radiates to back x 4 days. Saw pmd and was told it was gas, was given meds for the same and miralax. PT here for persistent symptoms.

## 2020-06-25 NOTE — ED Provider Notes (Signed)
[facility] Emergency Department Provider Note  ____________________________________________  Time seen: Approximately 3:38 AM  I have reviewed the triage vital signs and the nursing notes.   HISTORY  Chief Complaint Abdominal Pain   HPI Meghan Coleman is a 60 y.o. female with a history of UTI, arthritis who presents for evaluation of abdominal pain.  Patient reports 4 days of epigastric abdominal pain radiating to the back.  She reports that the pain is 8 out of 10 and constant.  She saw her primary care doctor who told her it could be gas.  She was treated with MiraLAX.  She reports mild relief for a day but the pain then returned.  She has had nausea associated with that but no vomiting, no diarrhea, no constipation, no chest pain, no shortness of breath, no fever or chills.  She denies any similar pain in the past.   Past Medical History:  Diagnosis Date  . Arthritis   . UTI (urinary tract infection) 09/03/2017  . Vertigo    no episodes in over 3 years  . Wears dentures    full upper, partial lower    Patient Active Problem List   Diagnosis Date Noted  . Status post left rotator cuff repair 12/28/2017  . Adhesive capsulitis of left shoulder 12/24/2017  . Injury of tendon of long head of left biceps 08/24/2017  . Labral tear of shoulder, degenerative, left 08/24/2017  . Nontraumatic complete tear of left rotator cuff 08/24/2017  . Tendinitis of right rotator cuff 08/16/2015  . Lumbar radiculopathy 07/23/2013  . Weakness of left leg 07/23/2013    Past Surgical History:  Procedure Laterality Date  . COLONOSCOPY    . SHOULDER ARTHROSCOPY WITH OPEN ROTATOR CUFF REPAIR Right 08/24/2015   Procedure: SHOULDER ARTHROSCOPY WITH OPEN ROTATOR CUFF REPAIR;  Surgeon: Christena Flake, MD;  Location: [facility];  Service: Orthopedics;  Laterality: Right;  . SHOULDER ARTHROSCOPY WITH OPEN ROTATOR CUFF REPAIR Left 09/11/2017   Procedure: SHOULDER ARTHROSCOPY WITH  OPEN ROTATOR CUFF REPAIR;  Surgeon: Christena Flake, MD;  Location: [facility];  Service: Orthopedics;  Laterality: Left;  . SHOULDER ARTHROSCOPY WITH SUBACROMIAL DECOMPRESSION AND BICEP TENDON REPAIR Left 04/30/2018   Procedure: SHOULDER ARTHROSCOPY WITH DEBRIDEMENT, DECOMPRESSION AND REPAIR OF RECURRENT ROTATOR CUFF TEAR-LEFT;  Surgeon: Christena Flake, MD;  Location: [facility];  Service: Orthopedics;  Laterality: Left;  . SHOULDER CLOSED REDUCTION Left 12/26/2017   Procedure: CLOSED MANIPULATION SHOULDER WITH STEROID INJECTION;  Surgeon: Christena Flake, MD;  Location: [facility];  Service: Orthopedics;  Laterality: Left;  . TUBAL LIGATION      Prior to Admission medications   Medication Sig Start Date End Date Taking? Authorizing Provider  dicyclomine (BENTYL) 10 MG capsule Take 1 capsule (10 mg total) by mouth 4 (four) times daily for 14 days. 06/25/20 07/09/20 Yes Emie Sommerfeld, [place], MD  ondansetron (ZOFRAN ODT) 4 MG disintegrating tablet Take 1 tablet (4 mg total) by mouth every 8 (eight) hours as needed. 06/25/20  Yes Don Perking, [place], MD  oxybutynin (DITROPAN XL) 15 MG 24 hr tablet Take 1 tablet (15 mg total) by mouth daily. 05/24/20   Alfredo Martinez, MD    Allergies Bactrim [sulfamethoxazole-trimethoprim]  Family History  Problem Relation Age of Onset  . Bladder Cancer Neg Hx   . Kidney cancer Neg Hx   . Breast cancer Neg Hx     Social History Social History   Tobacco Use  . Smoking status: Former Smoker  Packs/day: 0.25    Years: 1.00    Pack years: 0.25    Types: Cigarettes    Quit date: 08/19/1985    Years since quitting: 34.8  . Smokeless tobacco: Never Used  Vaping Use  . Vaping Use: Never used  Substance Use Topics  . Alcohol use: No  . Drug use: No    Review of Systems  Constitutional: Negative for fever. Eyes: Negative for visual changes. ENT: Negative for sore throat. Neck: No neck pain  Cardiovascular: Negative for chest pain. Respiratory:  Negative for shortness of breath. Gastrointestinal: + epigastric abdominal pain and nausea. No vomiting or diarrhea. Genitourinary: Negative for dysuria. Musculoskeletal: Negative for back pain. Skin: Negative for rash. Neurological: Negative for headaches, weakness or numbness. Psych: No SI or HI  ____________________________________________   PHYSICAL EXAM:  VITAL SIGNS: ED Triage Vitals  Enc Vitals Group     BP 06/25/20 0327 (!) 150/79     Pulse Rate 06/25/20 0327 81     Resp 06/25/20 0327 20     Temp 06/25/20 0327 98.4 F (36.9 C)     Temp Source 06/25/20 0327 Oral     SpO2 06/25/20 0327 100 %     Weight 06/25/20 0325 183 lb (83 kg)     Height 06/25/20 0325 5\' 1"  (1.549 m)     Head Circumference --      Peak Flow --      Pain Score 06/25/20 0325 8     Pain Loc --      Pain Edu? --      Excl. in GC? --     Constitutional: Alert and oriented. Well appearing and in no apparent distress. HEENT:      Head: Normocephalic and atraumatic.         Eyes: Conjunctivae are normal. Sclera is non-icteric.       Mouth/Throat: Mucous membranes are moist.       Neck: Supple with no signs of meningismus. Cardiovascular: Regular rate and rhythm. No murmurs, gallops, or rubs. 2+ symmetrical distal pulses are present in all extremities. No JVD. Respiratory: Normal respiratory effort. Lungs are clear to auscultation bilaterally.  Gastrointestinal: Soft, tender to palpation on the left upper quadrant and epigastric regions, and non distended with positive bowel sounds. No rebound or guarding. Genitourinary: No CVA tenderness. Musculoskeletal:  No edema, cyanosis, or erythema of extremities. Neurologic: Normal speech and language. Face is symmetric. Moving all extremities. No gross focal neurologic deficits are appreciated. Skin: Skin is warm, dry and intact. No rash noted. Psychiatric: Mood and affect are normal. Speech and behavior are  normal.  ____________________________________________   LABS (all labs ordered are listed, but only abnormal results are displayed)  Labs Reviewed  CBC WITH DIFFERENTIAL/PLATELET - Abnormal; Notable for the following components:      Result Value   Neutro Abs 1.6 (*)    All other components within normal limits  COMPREHENSIVE METABOLIC PANEL - Abnormal; Notable for the following components:   Potassium 3.3 (*)    All other components within normal limits  URINALYSIS, COMPLETE (UACMP) WITH MICROSCOPIC - Abnormal; Notable for the following components:   Color, Urine YELLOW (*)    APPearance HAZY (*)    Leukocytes,Ua SMALL (*)    All other components within normal limits  LIPASE, BLOOD  TROPONIN I (HIGH SENSITIVITY)   ____________________________________________  EKG  ED ECG REPORT I, Nita Sicklearolina Audrielle Vankuren, the attending physician, personally viewed and interpreted this ECG.  Sinus rhythm with a  rate of 78, normal intervals, normal axis, no ST elevations or depressions. ____________________________________________  RADIOLOGY  I have personally reviewed the images performed during this visit and I agree with the Radiologist's read.   Interpretation by Radiologist:  CT ABDOMEN PELVIS W CONTRAST  Result Date: 06/25/2020 CLINICAL DATA:  Upper abdominal pain EXAM: CT ABDOMEN AND PELVIS WITH CONTRAST TECHNIQUE: Multidetector CT imaging of the abdomen and pelvis was performed using the standard protocol following bolus administration of intravenous contrast. CONTRAST:  OMNIPAQUE IOHEXOL 300 MG/ML  SOLN COMPARISON:  None. FINDINGS: Lower chest: Visualized lung bases are clear. The visualized heart and pericardium are unremarkable. Hepatobiliary: Mild hepatic steatosis. Mild focal fatty sparing within the gallbladder fossa. No enhancing intrahepatic mass. No intra or extrahepatic biliary ductal dilation. Gallbladder unremarkable. Pancreas: Unremarkable Spleen: Unremarkable  Adrenals/Urinary Tract: The adrenal glands are unremarkable. The kidneys are normal in size and position. Multiple simple cortical cysts are seen bilaterally. No enhancing intrarenal masses are seen. No hydronephrosis. No intrarenal or ureteral calculi. The bladder is unremarkable. Stomach/Bowel: Stomach is within normal limits. Appendix appears normal. No evidence of bowel wall thickening, distention, or inflammatory changes. No free intraperitoneal gas or fluid. Vascular/Lymphatic: No significant vascular findings are present. No enlarged abdominal or pelvic lymph nodes. Reproductive: Uterus and bilateral adnexa are unremarkable. Other: No abdominal wall hernia.  Rectum unremarkable. Musculoskeletal: No acute bone abnormality. No lytic or blastic bone lesions are seen. IMPRESSION: No acute intra-abdominal pathology. No definite radiographic explanation for the patient's reported symptoms. Mild hepatic steatosis. Electronically Signed   By: Helyn Numbers MD   On: 06/25/2020 05:24     ____________________________________________   PROCEDURES  Procedure(s) performed: yes .1-3 Lead EKG Interpretation Performed by: Nita Sickle, MD Authorized by: Nita Sickle, MD     Interpretation: non-specific     ECG rate assessment: normal     Rhythm: sinus rhythm     Ectopy: none     Conduction: normal     Critical Care performed:  None ____________________________________________   INITIAL IMPRESSION / ASSESSMENT AND PLAN / ED COURSE  60 y.o. female with a history of UTI, arthritis who presents for evaluation of abdominal pain.  Patient with 4 days of sharp epigastric/left upper quadrant abdominal pain radiating to the back associated with nausea.  She is well-appearing in no distress with normal vital signs, she is tender to palpation mostly on the left upper quadrant with no rebound or guarding.  Ddx diverticulitis, constipation, PUD, gastritis, pancreatitis, GB pathology  EKG with no  signs of ischemia.  Blood work with no leukocytosis, no thrombocytosis, no significant electrolyte derangements, normal LFTs and lipase.  UA with no evidence of urinary tract infection.  Patient was given IV fentanyl and Zofran.  CT abdomen pelvis visualized by me with no acute intra-abdominal pathology, confirmed by radiology.  Patient still complaining of pain therefore will treat with IV Toradol, Bentyl, and IV Pepcid.  _________________________ 6:26 AM on 06/25/2020 -----------------------------------------  After IV Toradol and Bentyl and IV Pepcid patient feels markedly improved.  Abdomen soft and no tenderness.  Pain fully resolved.  She is tolerating p.o.  Will discharge home on Bentyl and Zofran.  Recommended close follow-up with PCP and return to the emergency room if pain recurs.     _____________________________________________ Please note:  Patient was evaluated in Emergency Department today for the symptoms described in the history of present illness. Patient was evaluated in the context of the global COVID-19 pandemic, which necessitated consideration that the patient  might be at risk for infection with the SARS-CoV-2 virus that causes COVID-19. Institutional protocols and algorithms that pertain to the evaluation of patients at risk for COVID-19 are in a state of rapid change based on information released by regulatory bodies including the CDC and federal and state organizations. These policies and algorithms were followed during the patient's care in the ED.  Some ED evaluations and interventions may be delayed as a result of limited staffing during the pandemic.   [place] Controlled Substance Database was reviewed by me. ____________________________________________   FINAL CLINICAL IMPRESSION(S) / ED DIAGNOSES   Final diagnoses:  Left upper quadrant abdominal pain      NEW MEDICATIONS STARTED DURING THIS VISIT:  ED Discharge Orders         Ordered    dicyclomine (BENTYL)  10 MG capsule  4 times daily        06/25/20 0626    ondansetron (ZOFRAN ODT) 4 MG disintegrating tablet  Every 8 hours PRN        06/25/20 3818           Note:  This document was prepared using Dragon voice recognition software and may include unintentional dictation errors.    Don Perking, [place], MD 06/25/20 [phone]

## 2020-06-28 ENCOUNTER — Emergency Department
Admission: EM | Admit: 2020-06-28 | Discharge: 2020-06-28 | Disposition: A | Discharge status: Home / Self Care | Payer: Medicare HMO | Attending: Emergency Medicine | Admitting: Emergency Medicine

## 2020-06-28 ENCOUNTER — Other Ambulatory Visit: Payer: Self-pay

## 2020-06-28 DIAGNOSIS — Z87891 Personal history of nicotine dependence: Secondary | ICD-10-CM | POA: Insufficient documentation

## 2020-06-28 DIAGNOSIS — R1012 Left upper quadrant pain: Secondary | ICD-10-CM | POA: Diagnosis not present

## 2020-06-28 DIAGNOSIS — B029 Zoster without complications: Secondary | ICD-10-CM | POA: Diagnosis not present

## 2020-06-28 DIAGNOSIS — R21 Rash and other nonspecific skin eruption: Secondary | ICD-10-CM | POA: Diagnosis present

## 2020-06-28 MED ORDER — VALACYCLOVIR HCL 1 G PO TABS: 1000.0000 mg | ORAL_TABLET | Freq: Three times a day (TID) | ORAL | 0 refills | Status: AC

## 2020-06-28 MED ORDER — VALACYCLOVIR HCL 1 G PO TABS: 1000.0000 mg | ORAL_TABLET | Freq: Three times a day (TID) | ORAL | 0 refills | Status: DC

## 2020-06-28 MED ORDER — GABAPENTIN 100 MG PO CAPS: 100.0000 mg | ORAL_CAPSULE | Freq: Three times a day (TID) | ORAL | 2 refills | Status: DC

## 2020-06-28 MED ORDER — PREDNISONE 20 MG PO TABS: 60.0000 mg | ORAL_TABLET | Freq: Every day | ORAL | 0 refills | Status: DC

## 2020-06-28 MED ORDER — PREDNISONE 20 MG PO TABS
60.0000 mg | ORAL_TABLET | Freq: Once | ORAL | Status: AC
  Administered 2020-06-28: 60 mg via ORAL
  Filled 2020-06-28: qty 3

## 2020-06-28 MED ORDER — GABAPENTIN 100 MG PO CAPS
100.0000 mg | ORAL_CAPSULE | Freq: Once | ORAL | Status: AC
  Administered 2020-06-28: 100 mg via ORAL
  Filled 2020-06-28: qty 1

## 2020-06-28 MED ORDER — PREDNISONE 20 MG PO TABS: 60.0000 mg | ORAL_TABLET | Freq: Every day | ORAL | 0 refills | Status: AC

## 2020-06-28 MED ORDER — VALACYCLOVIR HCL 500 MG PO TABS
1000.0000 mg | ORAL_TABLET | Freq: Once | ORAL | Status: AC
  Administered 2020-06-28: 1000 mg via ORAL
  Filled 2020-06-28: qty 2

## 2020-06-28 NOTE — ED Notes (Signed)
MD at bedside before [facility] waiver signed.

## 2020-06-28 NOTE — ED Triage Notes (Signed)
Pt states was here for abd pain on Friday. Pt states she was given medication that helped pain, but has now developed a rash to her LUQ and increased pain. Pt has shingles like rash to left upper quadrant around breast.

## 2020-06-28 NOTE — ED Provider Notes (Signed)
[facility] Emergency Department Provider Note  ____________________________________________  Time seen: Approximately 2:24 AM  I have reviewed the triage vital signs and the nursing notes.   HISTORY  Chief Complaint Abdominal Pain   HPI Meghan Coleman is a 60 y.o. female a history of UTI and arthritis who presents for evaluation of abdominal pain.  Pain started 6 days ago located in the left upper quadrant radiating to the left flank.  The pain is sharp and burning in quality, constant, and moderate in intensity.  She was seen here 2 days ago with a negative work-up.  Earlier today she noticed a rash that appeared in the distribution of where the pain is.  She reports that the rash is painful.  She denies ever having shingles before.  She has had no nausea, vomiting, dysuria or hematuria, fever or chills.   Past Medical History:  Diagnosis Date  . Arthritis   . UTI (urinary tract infection) 09/03/2017  . Vertigo    no episodes in over 3 years  . Wears dentures    full upper, partial lower    Patient Active Problem List   Diagnosis Date Noted  . Status post left rotator cuff repair 12/28/2017  . Adhesive capsulitis of left shoulder 12/24/2017  . Injury of tendon of long head of left biceps 08/24/2017  . Labral tear of shoulder, degenerative, left 08/24/2017  . Nontraumatic complete tear of left rotator cuff 08/24/2017  . Tendinitis of right rotator cuff 08/16/2015  . Lumbar radiculopathy 07/23/2013  . Weakness of left leg 07/23/2013    Past Surgical History:  Procedure Laterality Date  . COLONOSCOPY    . SHOULDER ARTHROSCOPY WITH OPEN ROTATOR CUFF REPAIR Right 08/24/2015   Procedure: SHOULDER ARTHROSCOPY WITH OPEN ROTATOR CUFF REPAIR;  Surgeon: Christena Flake, MD;  Location: [facility] ORS;  Service: Orthopedics;  Laterality: Right;  . SHOULDER ARTHROSCOPY WITH OPEN ROTATOR CUFF REPAIR Left 09/11/2017   Procedure: SHOULDER ARTHROSCOPY WITH OPEN ROTATOR  CUFF REPAIR;  Surgeon: Christena Flake, MD;  Location: [facility] OR[facility];  Service: Orthopedics;  Laterality: Left;  . SHOULDER ARTHROSCOPY WITH SUBACROMIAL DECOMPRESSION AND BICEP TENDON REPAIR Left 04/30/2018   Procedure: SHOULDER ARTHROSCOPY WITH DEBRIDEMENT, DECOMPRESSION AND REPAIR OF RECURRENT ROTATOR CUFF TEAR-LEFT;  Surgeon: Christena Flake, MD;  Location: [facility] ORS;  Service: Orthopedics;  Laterality: Left;  . SHOULDER CLOSED REDUCTION Left 12/26/2017   Procedure: CLOSED MANIPULATION SHOULDER WITH STEROID INJECTION;  Surgeon: Christena Flake, MD;  Location: [facility];  Service: Orthopedics;  Laterality: Left;  . TUBAL LIGATION      Prior to Admission medications   Medication Sig Start Date End Date Taking? Authorizing Provider  gabapentin (NEURONTIN) 100 MG capsule Take 1 capsule (100 mg total) by mouth 3 (three) times daily. 06/28/20 06/28/21 Yes Aneyah Lortz, [place], [place]  predniSONE (DELTASONE) 20 MG tablet Take 3 tablets (60 mg total) by mouth daily with breakfast for 4 days. 06/28/20 07/02/20 Yes Pallas Wahlert, [place], [place]  valACYclovir (VALTREX) 1000 MG tablet Take 1 tablet (1,000 mg total) by mouth 3 (three) times daily for 5 days. 06/28/20 07/03/20 Yes Lastacia Solum, [place], MD  dicyclomine (BENTYL) 10 MG capsule Take 1 capsule (10 mg total) by mouth 4 (four) times daily for 14 days. 06/25/20 07/09/20  Nita Sickle, MD  ondansetron (ZOFRAN ODT) 4 MG disintegrating tablet Take 1 tablet (4 mg total) by mouth every 8 (eight) hours as needed. 06/25/20   Nita Sickle, MD  oxybutynin (DITROPAN XL) 15 MG  24 hr tablet Take 1 tablet (15 mg total) by mouth daily. 05/24/20   Alfredo Martinez, MD    Allergies Bactrim [sulfamethoxazole-trimethoprim]  Family History  Problem Relation Age of Onset  . Bladder Cancer Neg Hx   . Kidney cancer Neg Hx   . Breast cancer Neg Hx     Social History Social History   Tobacco Use  . Smoking status: Former Smoker    Packs/day: 0.25    Years: 1.00    Pack years:  0.25    Types: Cigarettes    Quit date: 08/19/1985    Years since quitting: 34.8  . Smokeless tobacco: Never Used  Vaping Use  . Vaping Use: Never used  Substance Use Topics  . Alcohol use: No  . Drug use: No    Review of Systems  Constitutional: Negative for fever. Eyes: Negative for visual changes. ENT: Negative for sore throat. Neck: No neck pain  Cardiovascular: Negative for chest pain. Respiratory: Negative for shortness of breath. Gastrointestinal: Negative for abdominal pain, vomiting or diarrhea. Genitourinary: Negative for dysuria. Musculoskeletal: Negative for back pain. Skin: + rash. Neurological: Negative for headaches, weakness or numbness. Psych: No SI or HI  ____________________________________________   PHYSICAL EXAM:  VITAL SIGNS: ED Triage Vitals  Enc Vitals Group     BP 06/28/20 0208 (!) 173/80     Pulse Rate 06/28/20 0208 89     Resp 06/28/20 0208 18     Temp 06/28/20 0208 99.6 F (37.6 C)     Temp Source 06/28/20 0208 Oral     SpO2 06/28/20 0208 98 %     Weight 06/28/20 0207 183 lb (83 kg)     Height 06/28/20 0207 5\' 1"  (1.549 m)     Head Circumference --      Peak Flow --      Pain Score 06/28/20 0212 8     Pain Loc --      Pain Edu? --      Excl. in GC? --     Constitutional: Alert and oriented. Well appearing and in no apparent distress. HEENT:      Head: Normocephalic and atraumatic.         Eyes: Conjunctivae are normal. Sclera is non-icteric.       Mouth/Throat: Mucous membranes are moist.       Neck: Supple with no signs of meningismus. Cardiovascular: Regular rate and rhythm. No murmurs, gallops, or rubs. Respiratory: Normal respiratory effort. Lungs are clear to auscultation bilaterally.  Gastrointestinal: Soft, tender over the rash, and non distended with positive bowel sounds. No rebound or guarding. Genitourinary: No CVA tenderness. Musculoskeletal:  No edema, cyanosis, or erythema of extremities. Neurologic: Normal speech  and language. Face is symmetric. Moving all extremities. No gross focal neurologic deficits are appreciated. Skin: Skin is warm, dry and intact.  Patient has a vesicular maculopapular rash in a dermatomal distribution Psychiatric: Mood and affect are normal. Speech and behavior are normal.  ____________________________________________   LABS (all labs ordered are listed, but only abnormal results are displayed)  Labs Reviewed - No data to display ____________________________________________  EKG  none  ____________________________________________  RADIOLOGY  none  ____________________________________________   PROCEDURES  Procedure(s) performed: None Procedures Critical Care performed:  None ____________________________________________   INITIAL IMPRESSION / ASSESSMENT AND PLAN / ED COURSE  60 y.o. female a history of UTI and arthritis who presents for evaluation of abdominal pain.  Patient returns for left-sided abdominal pain radiating to the left flank  has been ongoing for 6 days.  I saw her 2 days ago with a negative work-up including imaging.  She now has a rash consistent with shingles.  Will start patient on Valtrex, prednisone and gabapentin.  Discussed the importance of staying away from immune suppressed patients and pregnant people.  Discussed close follow-up with PCP and my standard return precautions for any signs of disseminated zoster infection or superimposed infection.  I reviewed patient's recent visit in and my work-up for that appointment.       _____________________________________________ Please note:  Patient was evaluated in Emergency Department today for the symptoms described in the history of present illness. Patient was evaluated in the context of the global COVID-19 pandemic, which necessitated consideration that the patient might be at risk for infection with the SARS-CoV-2 virus that causes COVID-19. Institutional protocols and algorithms that  pertain to the evaluation of patients at risk for COVID-19 are in a state of rapid change based on information released by regulatory bodies including the CDC and federal and state organizations. These policies and algorithms were followed during the patient's care in the ED.  Some ED evaluations and interventions may be delayed as a result of limited staffing during the pandemic.   [place] Controlled Substance Database was reviewed by me. ____________________________________________   FINAL CLINICAL IMPRESSION(S) / ED DIAGNOSES   Final diagnoses:  Herpes zoster without complication      NEW MEDICATIONS STARTED DURING THIS VISIT:  ED Discharge Orders         Ordered    gabapentin (NEURONTIN) 100 MG capsule  3 times daily        06/28/20 0220    valACYclovir (VALTREX) 1000 MG tablet  3 times daily        06/28/20 0220    predniSONE (DELTASONE) 20 MG tablet  Daily with breakfast        06/28/20 0220           Note:  This document was prepared using Dragon voice recognition software and may include unintentional dictation errors.    Nita Sickle, MD 06/28/20 [phone]

## 2020-06-30 ENCOUNTER — Telehealth: Payer: Self-pay | Admitting: Urology

## 2020-06-30 NOTE — Telephone Encounter (Signed)
Pt. Stated that the medication "Oxybutynin" 15 mg.  She only needs a 30 day supply per prescription for cost issue's.  Please call Pt.

## 2020-07-01 NOTE — Telephone Encounter (Signed)
sw patient. Pt understands

## 2020-07-05 ENCOUNTER — Ambulatory Visit: Payer: Self-pay | Admitting: Urology

## 2020-07-21 ENCOUNTER — Other Ambulatory Visit: Payer: Self-pay | Admitting: Family Medicine

## 2020-07-21 DIAGNOSIS — Z1231 Encounter for screening mammogram for malignant neoplasm of breast: Secondary | ICD-10-CM

## 2020-08-19 ENCOUNTER — Ambulatory Visit
Admission: RE | Admit: 2020-08-19 | Discharge: 2020-08-19 | Disposition: A | Discharge status: Home / Self Care | Payer: Medicare HMO | Source: Ambulatory Visit | Attending: Family Medicine | Admitting: Family Medicine

## 2020-08-19 ENCOUNTER — Other Ambulatory Visit: Payer: Self-pay

## 2020-08-19 DIAGNOSIS — Z1231 Encounter for screening mammogram for malignant neoplasm of breast: Secondary | ICD-10-CM | POA: Insufficient documentation

## 2021-08-15 ENCOUNTER — Ambulatory Visit: Payer: Medicare HMO | Admitting: Urology

## 2021-08-15 ENCOUNTER — Encounter: Payer: Self-pay | Admitting: Urology

## 2021-08-15 VITALS — BP 142/82 | HR 90 | Ht 61.0 in | Wt 176.0 lb

## 2021-08-15 DIAGNOSIS — N3946 Mixed incontinence: Secondary | ICD-10-CM | POA: Diagnosis not present

## 2021-08-15 LAB — URINALYSIS, COMPLETE
Bilirubin, UA: NEGATIVE
Glucose, UA: NEGATIVE
Ketones, UA: NEGATIVE
Nitrite, UA: NEGATIVE
Protein,UA: NEGATIVE
RBC, UA: NEGATIVE
Specific Gravity, UA: 1.015 (ref 1.005–1.030)
Urobilinogen, Ur: 0.2 mg/dL (ref 0.2–1.0)
pH, UA: 6.5 (ref 5.0–7.5)

## 2021-08-15 LAB — MICROSCOPIC EXAMINATION: Epithelial Cells (non renal): >10 /hpf — AB (ref 0–10)

## 2021-08-15 MED ORDER — OXYBUTYNIN CHLORIDE ER 15 MG PO TB24: 15.0000 mg | ORAL_TABLET | Freq: Every day | ORAL | 11 refills | Status: DC

## 2021-08-15 NOTE — Addendum Note (Signed)
Addended by: Sueanne Margarita on: 08/15/2021 11:37 AM   Modules accepted: Orders

## 2021-08-15 NOTE — Progress Notes (Signed)
08/15/2021 9:33 AM   [name] [birth year] [id number]  Referring provider: [facility] [address] [place],  [place] [address]  Chief Complaint  Patient presents with   Urinary Incontinence    1 year follow up    HPI: was consulted to assess the patient's urinary incontinence worsening over months to years. She leaks with coughing sneezing bending and lifting and also reports urge incontinence and both are significant. She can have mild bedwetting but also significant foot on the floor syndrome. She wears 3 pads per day moderately wet.   She voids every 90 minutes and gets up 3 times per night to urinate.   Mild to moderate grade 2 hypermobility the bladder neck and a negative cough test. Grade 1 cystocele and no rectocele   The patient has mixed incontinence and moderate nighttime symptoms including enuresis. She likely has a significant overactive bladder component. She has moderate frequency and nighttime frequency as well.   On urodynamics she emptied efficiently. Her bladder capacity was 260 mL. Bladder was unstable reaching a pressure of 15 cm water. She leaked a mild amount. She was triggering instability with coughing. Steward Drone felt that she did leak at 80 cm of water at 100 mL during the second filling. She then triggered another contraction. She did not leak at 86 cm water. During voluntary voiding she voided 148 mL with a maximal flow 20 mils per second. Maximum voiding pressure was 17 cm water. She emptied efficiently.    The patient primarily has an overactive bladder with mild stress incontinence by history and perhaps urodynamics. One would need to re-quantitate the stress component if she fails medical and behavioral therapy for the overactive bladder component.   She chose not to see the physical therapist.    The beta 3 agonist helped for a few weeks but the effect wore off.    To clarify I believe the patient failed  Vesicare 10 mg samples but was doing great and was dry on oxybutynin ER 10 mg twice a day.      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  Was on oxybutynin was given Macrobid.  Culture was positive Clinically not infected they have a send urine for culture   Even before the infection she started to have a little bit more urgency incontinence wearing 3 pads a day that I think are generally damp.  She was almost completely dry before.  We decided to increase to oxybutynin ER 15 mg and reevaluate treatment options in about 6 weeks   She said that something near her bladder was seen on an x-ray.  She was noted to have multiple small benign renal cysts stable since 2015 on a back MRI    Today I have not seen the patient for more than a year.  Culture was negative last year Excellent continence on oxybutynin.  Clinically not infected   PMH: Past Medical History:  Diagnosis Date   Arthritis    UTI (urinary tract infection) 09/03/2017   Vertigo    no episodes in over 3 years   Wears dentures    full upper, partial lower    Surgical History: Past Surgical History:  Procedure Laterality Date   COLONOSCOPY     SHOULDER ARTHROSCOPY WITH OPEN ROTATOR CUFF REPAIR Right 08/24/2015   Procedure: SHOULDER ARTHROSCOPY WITH OPEN ROTATOR CUFF REPAIR;  Surgeon: Christena Flake, MD;  Location: [facility];  Service: Orthopedics;  Laterality: Right;   SHOULDER  ARTHROSCOPY WITH OPEN ROTATOR CUFF REPAIR Left 09/11/2017   Procedure: SHOULDER ARTHROSCOPY WITH OPEN ROTATOR CUFF REPAIR;  Surgeon: Christena Flake, MD;  Location: [facility];  Service: Orthopedics;  Laterality: Left;   SHOULDER ARTHROSCOPY WITH SUBACROMIAL DECOMPRESSION AND BICEP TENDON REPAIR Left 04/30/2018   Procedure: SHOULDER ARTHROSCOPY WITH DEBRIDEMENT, DECOMPRESSION AND REPAIR OF RECURRENT ROTATOR CUFF TEAR-LEFT;  Surgeon: Christena Flake, MD;  Location: [facility];  Service: Orthopedics;  Laterality: Left;   SHOULDER CLOSED REDUCTION Left 12/26/2017    Procedure: CLOSED MANIPULATION SHOULDER WITH STEROID INJECTION;  Surgeon: Christena Flake, MD;  Location: [facility];  Service: Orthopedics;  Laterality: Left;   TUBAL LIGATION      Home Medications:  Allergies as of 08/15/2021       Reactions   Bactrim [sulfamethoxazole-trimethoprim] Other (See Comments)   Burning to her mouth        Medication List        Accurate as of August 15, 2021  9:33 AM. If you have any questions, ask your nurse or doctor.          STOP taking these medications    dicyclomine 10 MG capsule Commonly known as: Bentyl Stopped by: Martina Sinner, MD       TAKE these medications    gabapentin 300 MG capsule Commonly known as: NEURONTIN Take by mouth. What changed: Another medication with the same name was removed. Continue taking this medication, and follow the directions you see here. Changed by: Martina Sinner, MD   ondansetron 4 MG disintegrating tablet Commonly known as: Zofran ODT Take 1 tablet (4 mg total) by mouth every 8 (eight) hours as needed.   oxybutynin 15 MG 24 hr tablet Commonly known as: DITROPAN XL Take 1 tablet (15 mg total) by mouth daily.   traMADol 50 MG tablet Commonly known as: ULTRAM Take 50 mg by mouth 2 (two) times daily as needed.        Allergies:  Allergies  Allergen Reactions   Bactrim [Sulfamethoxazole-Trimethoprim] Other (See Comments)    Burning to her mouth    Family History: Family History  Problem Relation Age of Onset   Breast cancer Cousin 42       Moms side   Bladder Cancer Neg Hx    Kidney cancer Neg Hx     Social History:  reports that she quit smoking about 36 years ago. Her smoking use included cigarettes. She has a 0.25 pack-year smoking history. She has never used smokeless tobacco. She reports that she does not drink alcohol and does not use drugs.  ROS:                                        Physical Exam: BP (!) 142/82   Pulse 90    Ht 5\' 1"  (1.549 m)   Wt 176 lb (79.8 kg)   BMI 33.25 kg/m   Constitutional:  Alert and oriented, No acute distress. HEENT: [place] AT, moist mucus membranes.  Trachea midline, no masses.   Laboratory Data: Lab Results  Component Value Date   WBC 5.3 06/25/2020   HGB 12.6 06/25/2020   HCT 36.0 06/25/2020   MCV 83.1 06/25/2020   PLT 287 06/25/2020    Lab Results  Component Value Date   CREATININE 0.67 06/25/2020    No results found for: "PSA"  No results found for: "TESTOSTERONE"  No results found for: "HGBA1C"  Urinalysis    Component Value Date/Time   COLORURINE YELLOW (A) 06/25/2020 0350   APPEARANCEUR HAZY (A) 06/25/2020 0350   APPEARANCEUR Clear 05/24/2020 1415   LABSPEC 1.011 06/25/2020 0350   LABSPEC 1.010 05/26/2014 1545   PHURINE 7.0 06/25/2020 0350   GLUCOSEU NEGATIVE 06/25/2020 0350   GLUCOSEU Negative 05/26/2014 1545   HGBUR NEGATIVE 06/25/2020 0350   BILIRUBINUR NEGATIVE 06/25/2020 0350   BILIRUBINUR Negative 05/24/2020 1415   BILIRUBINUR Negative 05/26/2014 1545   KETONESUR NEGATIVE 06/25/2020 0350   PROTEINUR NEGATIVE 06/25/2020 0350   NITRITE NEGATIVE 06/25/2020 0350   LEUKOCYTESUR SMALL (A) 06/25/2020 0350   LEUKOCYTESUR 1+ 05/26/2014 1545    Pertinent Imaging:   Assessment & Plan: 30x11 oxybutynin sent to pharmacy and I will see in 1 year  1. Mixed incontinence  - Urinalysis, Complete   No follow-ups on file.  Reece Packer, MD  [place] [address] [place], [place] [address] [phone]

## 2021-08-18 LAB — CULTURE, URINE COMPREHENSIVE

## 2021-12-01 ENCOUNTER — Telehealth: Payer: Self-pay | Admitting: Urology

## 2021-12-01 ENCOUNTER — Telehealth: Payer: Self-pay

## 2021-12-01 DIAGNOSIS — N3946 Mixed incontinence: Secondary | ICD-10-CM

## 2021-12-01 NOTE — Telephone Encounter (Signed)
Patient came in and was questioning if she could get a quantity of 60 tablets instead of 30 tablets of medication listed below?   oxybutynin (DITROPAN XL) 15 MG 24 hr tablet

## 2021-12-01 NOTE — Telephone Encounter (Signed)
Opened in error

## 2021-12-01 NOTE — Telephone Encounter (Signed)
Pt calling stating that she took 30mg  of oxybutynin in 2018, 2019 & 2020 now asking for a refill to take 2 tablets daily? Please advise if ok to send.

## 2021-12-05 MED ORDER — OXYBUTYNIN CHLORIDE ER 15 MG PO TB24: 30.0000 mg | ORAL_TABLET | Freq: Every day | ORAL | 11 refills | Status: DC

## 2021-12-05 NOTE — Telephone Encounter (Signed)
rx sent to pharmacy by e-script Spoke with patient and advised results   

## 2021-12-13 ENCOUNTER — Ambulatory Visit: Payer: Medicare HMO | Admitting: Urology

## 2022-07-03 IMAGING — CT CT ABD-PELV W/ CM
2 of 5 series · 16 of 46 positions shown, 18 images · IV contrast (APPLIED)
Comparison: None.

CLINICAL DATA: Upper abdominal pain

EXAM:
CT ABDOMEN AND PELVIS WITH CONTRAST
TECHNIQUE: Multidetector CT imaging of the abdomen and pelvis was performed
using the standard protocol following bolus administration of
intravenous contrast.
CONTRAST:  100mL OMNIPAQUE IOHEXOL 300 MG/ML  SOLN

[Series 2: axial st · axial · 0.84mm/px · z∈[-486,-91]mm · 13 of 91 slices shown, 15 images]
[im 6/91  soft-tissue]
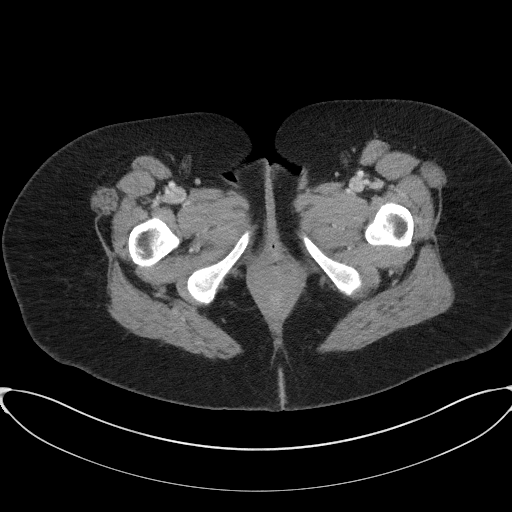
[im 6/91  bone]
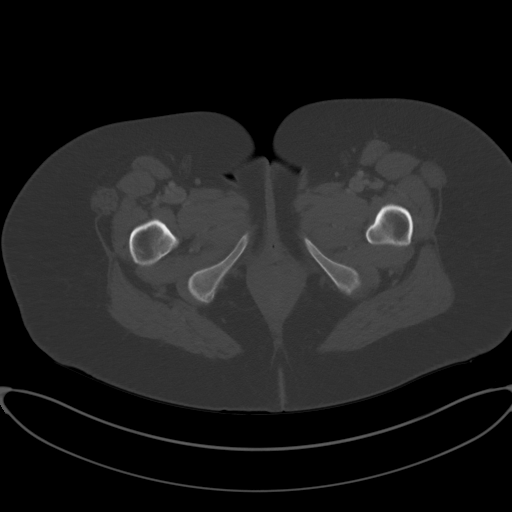
[im 11/91  soft-tissue]
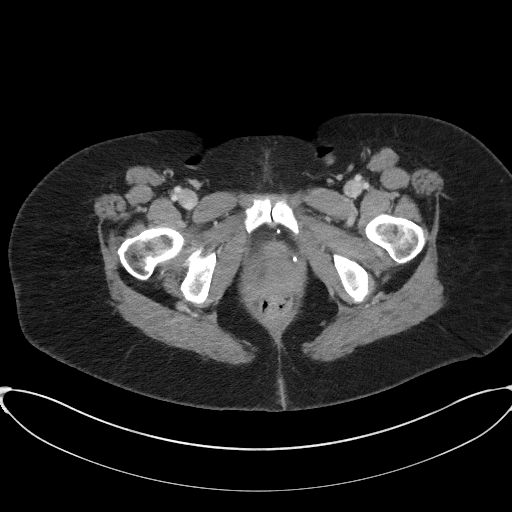
[im 22/91  soft-tissue]
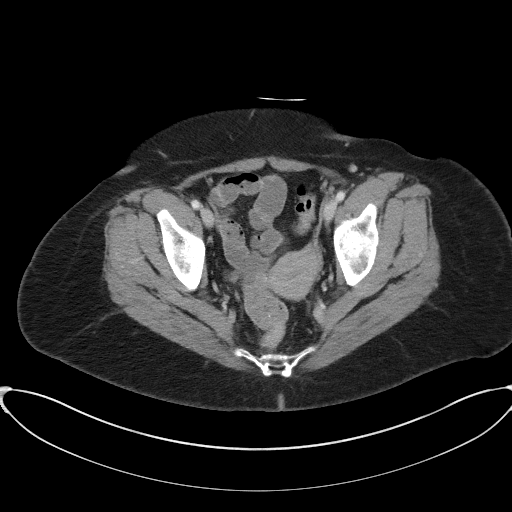
[im 27/91  soft-tissue]
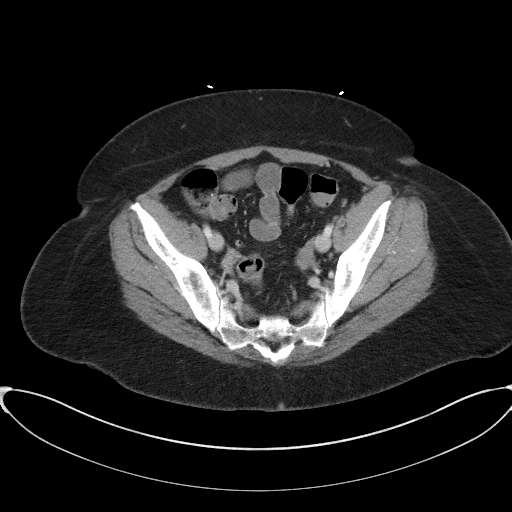
[im 32/91  soft-tissue]
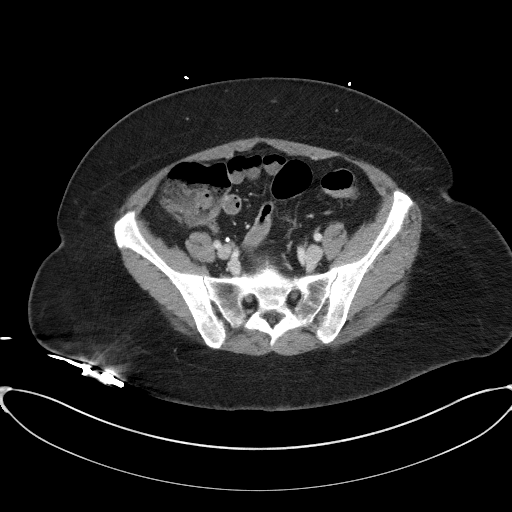
[im 38/91  soft-tissue]
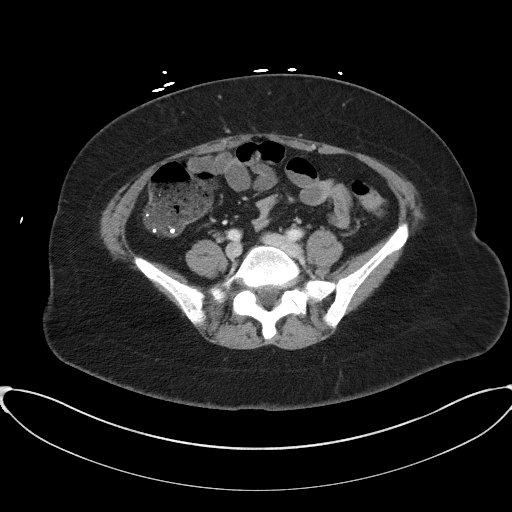
[im 48/91  soft-tissue]
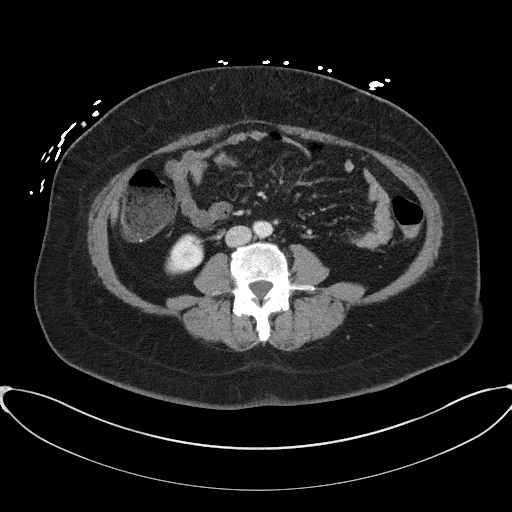
[im 53/91  soft-tissue]
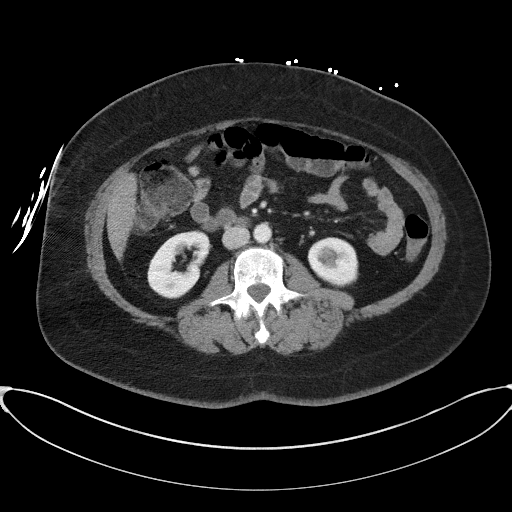
[im 59/91  soft-tissue]
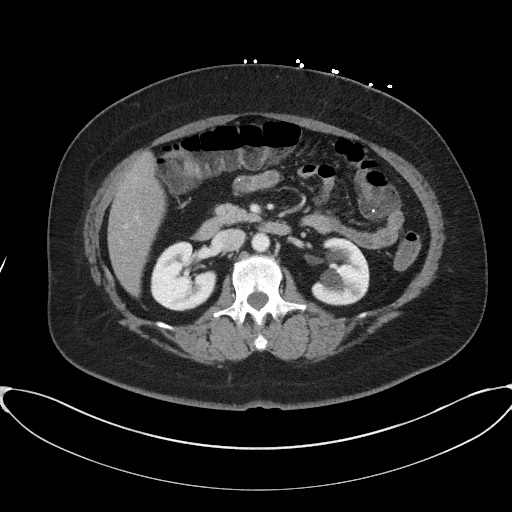
[im 59/91  bone]
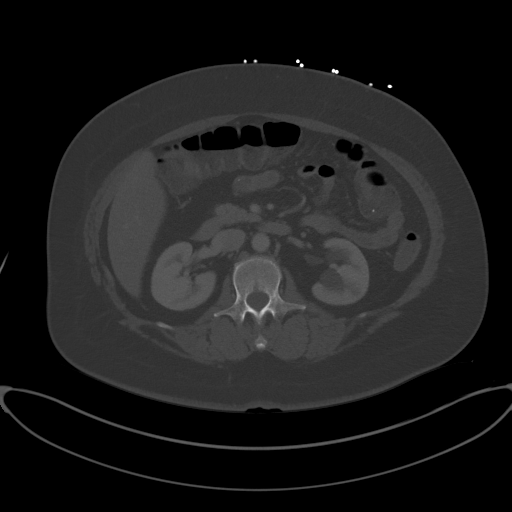
[im 64/91  soft-tissue]
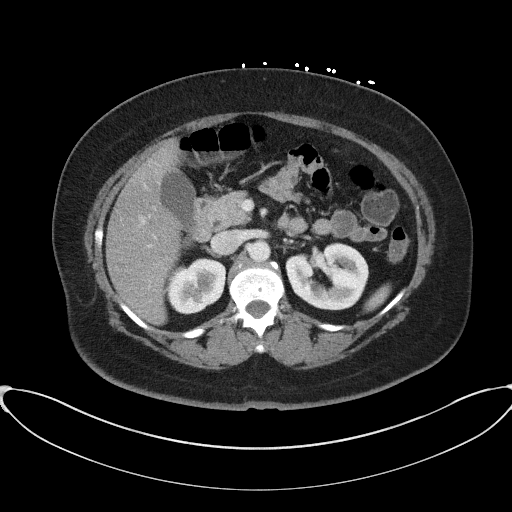
[im 69/91  soft-tissue]
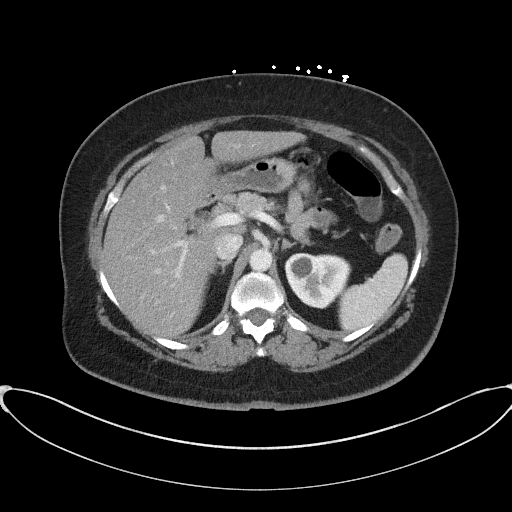
[im 80/91  soft-tissue]
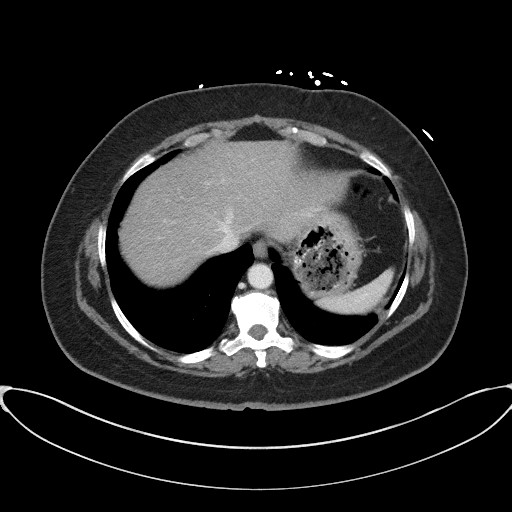
[im 85/91  soft-tissue]
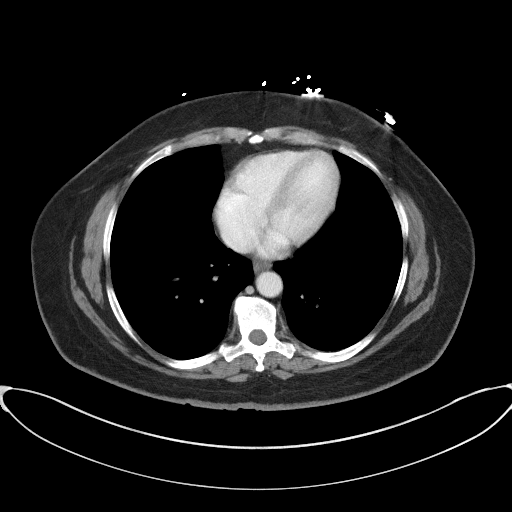

[Series 5: coronal st · coronal · 0.65mm/px · 3 of 83 slices shown]
[im 28/83  soft-tissue]
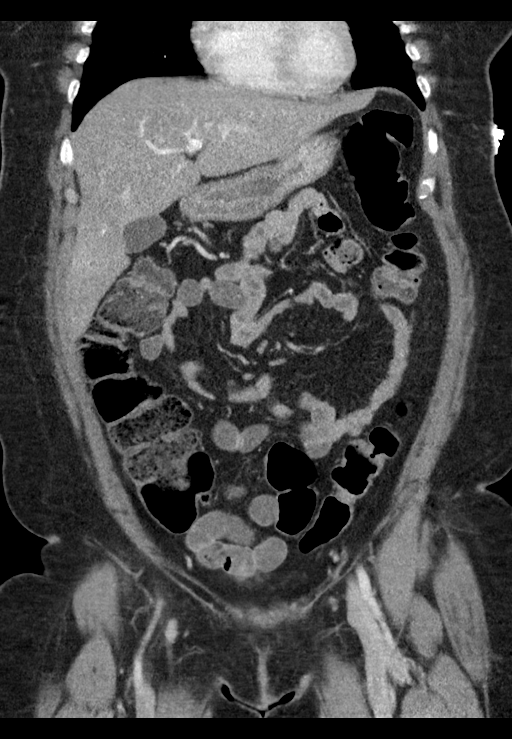
[im 37/83  soft-tissue]
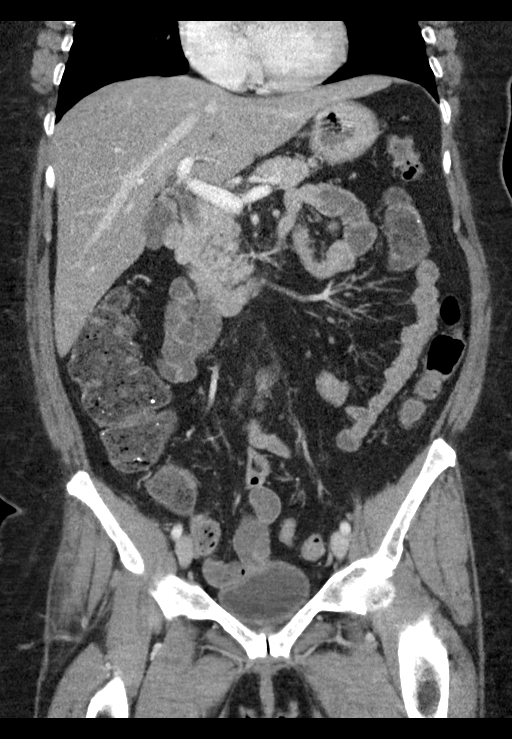
[im 46/83  soft-tissue]
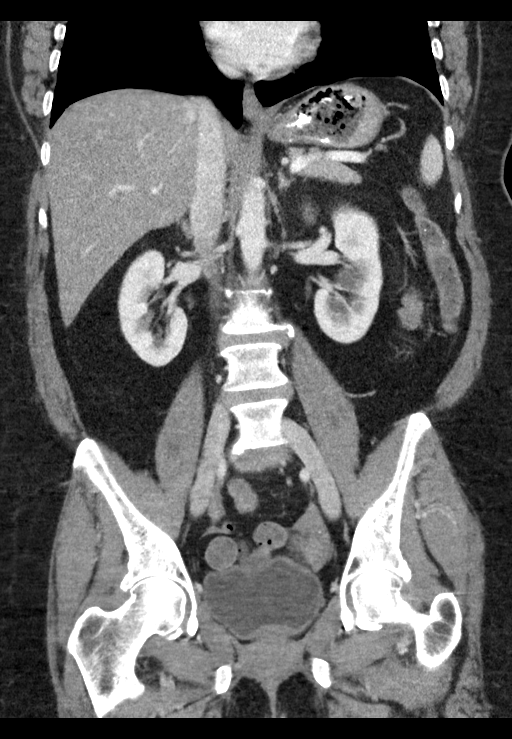

[16 of 46 positions shown; findings below may reference images not displayed]

FINDINGS: Lower chest: Visualized lung bases are clear. The visualized heart
and pericardium are unremarkable.

Hepatobiliary: Mild hepatic steatosis. Mild focal fatty sparing
within the gallbladder fossa. No enhancing intrahepatic mass. No
intra or extrahepatic biliary ductal dilation. Gallbladder
unremarkable.

Pancreas: Unremarkable

Spleen: Unremarkable

Adrenals/Urinary Tract: The adrenal glands are unremarkable. The
kidneys are normal in size and position. Multiple simple cortical
cysts are seen bilaterally. No enhancing intrarenal masses are seen.
No hydronephrosis. No intrarenal or ureteral calculi. The bladder is
unremarkable.

Stomach/Bowel: Stomach is within normal limits. Appendix appears
normal. No evidence of bowel wall thickening, distention, or
inflammatory changes. No free intraperitoneal gas or fluid.

Vascular/Lymphatic: No significant vascular findings are present. No
enlarged abdominal or pelvic lymph nodes.

Reproductive: Uterus and bilateral adnexa are unremarkable.

Other: No abdominal wall hernia.  Rectum unremarkable.

Musculoskeletal: No acute bone abnormality. No lytic or blastic bone
lesions are seen.
IMPRESSION: No acute intra-abdominal pathology. No definite radiographic
explanation for the patient's reported symptoms.

Mild hepatic steatosis.

## 2022-08-14 ENCOUNTER — Ambulatory Visit: Payer: Medicare HMO | Admitting: Urology

## 2022-08-14 VITALS — BP 139/84 | HR 80 | Ht 61.0 in | Wt 176.0 lb

## 2022-08-14 DIAGNOSIS — N3946 Mixed incontinence: Secondary | ICD-10-CM

## 2022-08-14 LAB — URINALYSIS, COMPLETE
Bilirubin, UA: NEGATIVE
Glucose, UA: NEGATIVE
Ketones, UA: NEGATIVE
Leukocytes,UA: NEGATIVE
Nitrite, UA: NEGATIVE
Protein,UA: NEGATIVE
RBC, UA: NEGATIVE
Specific Gravity, UA: 1.015 (ref 1.005–1.030)
Urobilinogen, Ur: 0.2 mg/dL (ref 0.2–1.0)
pH, UA: 6.5 (ref 5.0–7.5)

## 2022-08-14 LAB — MICROSCOPIC EXAMINATION: Epithelial Cells (non renal): >10 /hpf — AB (ref 0–10)

## 2022-08-14 MED ORDER — OXYBUTYNIN CHLORIDE ER 15 MG PO TB24: 30.0000 mg | ORAL_TABLET | Freq: Every day | ORAL | 11 refills | Status: DC

## 2022-08-14 NOTE — Progress Notes (Signed)
08/14/2022 9:25 AM   [name] 14-Apr-1960 [id number]  Referring provider: Center, [facility] [address] [place],  [place] [address]  Chief Complaint  Patient presents with   Follow-up   Urinary Incontinence    HPI: was consulted to assess the patient's urinary incontinence worsening over months to years. She leaks with coughing sneezing bending and lifting and also reports urge incontinence and both are significant. She can have mild bedwetting but also significant foot on the floor syndrome. She wears 3 pads per day moderately wet.   She voids every 90 minutes and gets up 3 times per night to urinate.   Mild to moderate grade 2 hypermobility the bladder neck and a negative cough test. Grade 1 cystocele and no rectocele   The patient has mixed incontinence and moderate nighttime symptoms including enuresis. She likely has a significant overactive bladder component. She has moderate frequency and nighttime frequency as well.   On urodynamics she emptied efficiently. Her bladder capacity was 260 mL. Bladder was unstable reaching a pressure of 15 cm water. She leaked a mild amount. She was triggering instability with coughing. Steward Drone felt that she did leak at 80 cm of water at 100 mL during the second filling. She then triggered another contraction. She did not leak at 86 cm water. During voluntary voiding she voided 148 mL with a maximal flow 20 mils per second. Maximum voiding pressure was 17 cm water. She emptied efficiently.    The patient primarily has an overactive bladder with mild stress incontinence by history and perhaps urodynamics. One would need to re-quantitate the stress component if she fails medical and behavioral therapy for the overactive bladder component.   She chose not to see the physical therapist.    The beta 3 agonist helped for a few weeks but the effect wore off.    To clarify I believe the patient failed Vesicare 10  mg samples but was doing great and was dry on oxybutynin ER 10 mg twice a day.      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  Was on oxybutynin was given Macrobid.  Culture was positive Clinically not infected they have a send urine for culture   Even before the infection she started to have a little bit more urgency incontinence wearing 3 pads a day that I think are generally damp.  She was almost completely dry before.  We decided to increase to oxybutynin ER 15 mg and reevaluate treatment options in about 6 weeks   She said that something near her bladder was seen on an x-ray.  She was noted to have multiple small benign renal cysts stable since 2015 on a back MRI     Today I have not seen the patient for more than a year.  Culture was negative last year Excellent continence on oxybutynin.  Clinically not infected oxybutynin renewed  Today Frequency stable Pain very well on oxybutynin.  No infections.   PMH: Past Medical History:  Diagnosis Date   Arthritis    UTI (urinary tract infection) 09/03/2017   Vertigo    no episodes in over 3 years   Wears dentures    full upper, partial lower    Surgical History: Past Surgical History:  Procedure Laterality Date   COLONOSCOPY     SHOULDER ARTHROSCOPY WITH OPEN ROTATOR CUFF REPAIR Right 08/24/2015   Procedure: SHOULDER ARTHROSCOPY WITH OPEN ROTATOR CUFF REPAIR;  Surgeon: Christena Flake, MD;  Location:  [facility];  Service: Orthopedics;  Laterality: Right;   SHOULDER ARTHROSCOPY WITH OPEN ROTATOR CUFF REPAIR Left 09/11/2017   Procedure: SHOULDER ARTHROSCOPY WITH OPEN ROTATOR CUFF REPAIR;  Surgeon: Christena Flake, MD;  Location: [facility];  Service: Orthopedics;  Laterality: Left;   SHOULDER ARTHROSCOPY WITH SUBACROMIAL DECOMPRESSION AND BICEP TENDON REPAIR Left 04/30/2018   Procedure: SHOULDER ARTHROSCOPY WITH DEBRIDEMENT, DECOMPRESSION AND REPAIR OF RECURRENT ROTATOR CUFF TEAR-LEFT;  Surgeon: Christena Flake, MD;  Location: [facility];   Service: Orthopedics;  Laterality: Left;   SHOULDER CLOSED REDUCTION Left 12/26/2017   Procedure: CLOSED MANIPULATION SHOULDER WITH STEROID INJECTION;  Surgeon: Christena Flake, MD;  Location: [facility];  Service: Orthopedics;  Laterality: Left;   TUBAL LIGATION      Home Medications:  Allergies as of 08/14/2022       Reactions   Bactrim [sulfamethoxazole-trimethoprim] Other (See Comments)   Burning to her mouth        Medication List        Accurate as of August 14, 2022  9:25 AM. If you have any questions, ask your nurse or doctor.          gabapentin 300 MG capsule Commonly known as: NEURONTIN Take by mouth.   ondansetron 4 MG disintegrating tablet Commonly known as: Zofran ODT Take 1 tablet (4 mg total) by mouth every 8 (eight) hours as needed.   oxybutynin 15 MG 24 hr tablet Commonly known as: DITROPAN XL Take 2 tablets (30 mg total) by mouth daily.   traMADol 50 MG tablet Commonly known as: ULTRAM Take 50 mg by mouth 2 (two) times daily as needed.        Allergies:  Allergies  Allergen Reactions   Bactrim [Sulfamethoxazole-Trimethoprim] Other (See Comments)    Burning to her mouth    Family History: Family History  Problem Relation Age of Onset   Breast cancer Cousin 75       Moms side   Bladder Cancer Neg Hx    Kidney cancer Neg Hx     Social History:  reports that she quit smoking about 37 years ago. Her smoking use included cigarettes. She has a 0.25 pack-year smoking history. She has never used smokeless tobacco. She reports that she does not drink alcohol and does not use drugs.  ROS:                                        Physical Exam: There were no vitals taken for this visit.  Constitutional:  Alert and oriented, No acute distress. HEENT: [place] AT, moist mucus membranes.  Trachea midline, no masses.   Laboratory Data: Lab Results  Component Value Date   WBC 5.3 06/25/2020   HGB 12.6 06/25/2020   HCT  36.0 06/25/2020   MCV 83.1 06/25/2020   PLT 287 06/25/2020    Lab Results  Component Value Date   CREATININE 0.67 06/25/2020    No results found for: "PSA"  No results found for: "TESTOSTERONE"  No results found for: "HGBA1C"  Urinalysis    Component Value Date/Time   COLORURINE YELLOW (A) 06/25/2020 0350   APPEARANCEUR Clear 08/15/2021 0922   LABSPEC 1.011 06/25/2020 0350   LABSPEC 1.010 05/26/2014 1545   PHURINE 7.0 06/25/2020 0350   GLUCOSEU Negative 08/15/2021 0922   GLUCOSEU Negative 05/26/2014 1545   HGBUR NEGATIVE 06/25/2020 0350   BILIRUBINUR Negative 08/15/2021  0922   BILIRUBINUR Negative 05/26/2014 1545   KETONESUR NEGATIVE 06/25/2020 0350   PROTEINUR Negative 08/15/2021 0922   PROTEINUR NEGATIVE 06/25/2020 0350   NITRITE Negative 08/15/2021 0922   NITRITE NEGATIVE 06/25/2020 0350   LEUKOCYTESUR 1+ (A) 08/15/2021 0922   LEUKOCYTESUR SMALL (A) 06/25/2020 0350   LEUKOCYTESUR 1+ 05/26/2014 1545    Pertinent Imaging:   Assessment & Plan: 90 x 3 oxybutynin sent to pharmacy and I will see near  1. Mixed incontinence  - Urinalysis, Complete   No follow-ups on file.  Martina Sinner, MD  [facility] [address] [place], [place] [address] [phone]

## 2022-08-14 NOTE — Addendum Note (Signed)
Addended by: Sueanne Margarita on: 08/14/2022 09:38 AM   Modules accepted: Orders

## 2023-01-30 ENCOUNTER — Other Ambulatory Visit: Payer: Self-pay | Admitting: Physician Assistant

## 2023-01-30 DIAGNOSIS — Z1231 Encounter for screening mammogram for malignant neoplasm of breast: Secondary | ICD-10-CM

## 2023-02-12 ENCOUNTER — Ambulatory Visit
Admission: RE | Admit: 2023-02-12 | Discharge: 2023-02-12 | Disposition: A | Discharge status: Home / Self Care | Payer: Medicare HMO | Source: Ambulatory Visit | Attending: Physician Assistant | Admitting: Physician Assistant

## 2023-02-12 DIAGNOSIS — Z1231 Encounter for screening mammogram for malignant neoplasm of breast: Secondary | ICD-10-CM | POA: Insufficient documentation

## 2023-04-05 ENCOUNTER — Other Ambulatory Visit: Payer: Self-pay | Admitting: *Deleted

## 2023-04-05 DIAGNOSIS — N3946 Mixed incontinence: Secondary | ICD-10-CM

## 2023-04-05 MED ORDER — OXYBUTYNIN CHLORIDE ER 15 MG PO TB24: 30.0000 mg | ORAL_TABLET | Freq: Every day | ORAL | 3 refills | Status: AC

## 2023-08-13 ENCOUNTER — Ambulatory Visit: Payer: Self-pay | Admitting: Urology

## 2023-12-14 ENCOUNTER — Other Ambulatory Visit: Payer: Self-pay | Admitting: Physician Assistant

## 2023-12-14 DIAGNOSIS — Z1231 Encounter for screening mammogram for malignant neoplasm of breast: Secondary | ICD-10-CM

## 2024-02-14 ENCOUNTER — Telehealth: Payer: Self-pay

## 2024-02-14 NOTE — Telephone Encounter (Signed)
 Left voicemail message for patient requesting a return call to discuss appointment scheduling for a 1 year follow with Dr.MacDiarmid. No PHI disclosed. Provided main clinic number.    Pt's pharmacy called requesting more refills. Pt has not been seen in over one year.
